# Patient Record
Sex: Female | Born: 1990
Health system: Southern US, Community
[De-identification: ages and names within clinical notes are randomized; demographics above are authoritative.]

## PROBLEM LIST (undated history)

## (undated) DIAGNOSIS — J302 Other seasonal allergic rhinitis: Secondary | ICD-10-CM

## (undated) DIAGNOSIS — M940 Chondrocostal junction syndrome [Tietze]: Secondary | ICD-10-CM

## (undated) DIAGNOSIS — L732 Hidradenitis suppurativa: Secondary | ICD-10-CM

## (undated) DIAGNOSIS — S149XXA Injury of unspecified nerves of neck, initial encounter: Secondary | ICD-10-CM

## (undated) HISTORY — DX: Injury of unspecified nerves of neck, initial encounter: S14.9XXA

## (undated) HISTORY — PX: NO PAST SURGERIES: SHX2092

---

## 2010-02-23 DIAGNOSIS — G589 Mononeuropathy, unspecified: Secondary | ICD-10-CM

## 2010-02-23 HISTORY — DX: Mononeuropathy, unspecified: G58.9

## 2011-01-23 ENCOUNTER — Emergency Department (HOSPITAL_COMMUNITY)
Admission: EM | Admit: 2011-01-23 | Discharge: 2011-01-23 | Disposition: A | Discharge status: Home / Self Care | Payer: BC Managed Care – PPO | Attending: Emergency Medicine | Admitting: Emergency Medicine

## 2011-01-23 ENCOUNTER — Encounter: Payer: Self-pay | Admitting: Emergency Medicine

## 2011-01-23 DIAGNOSIS — R209 Unspecified disturbances of skin sensation: Secondary | ICD-10-CM | POA: Insufficient documentation

## 2011-01-23 DIAGNOSIS — IMO0002 Reserved for concepts with insufficient information to code with codable children: Secondary | ICD-10-CM | POA: Insufficient documentation

## 2011-01-23 DIAGNOSIS — S139XXA Sprain of joints and ligaments of unspecified parts of neck, initial encounter: Secondary | ICD-10-CM | POA: Insufficient documentation

## 2011-01-23 DIAGNOSIS — M2569 Stiffness of other specified joint, not elsewhere classified: Secondary | ICD-10-CM | POA: Insufficient documentation

## 2011-01-23 DIAGNOSIS — T07XXXA Unspecified multiple injuries, initial encounter: Secondary | ICD-10-CM

## 2011-01-23 DIAGNOSIS — M549 Dorsalgia, unspecified: Secondary | ICD-10-CM | POA: Insufficient documentation

## 2011-01-23 DIAGNOSIS — M542 Cervicalgia: Secondary | ICD-10-CM | POA: Insufficient documentation

## 2011-01-23 DIAGNOSIS — X58XXXA Exposure to other specified factors, initial encounter: Secondary | ICD-10-CM | POA: Insufficient documentation

## 2011-01-23 MED ORDER — IBUPROFEN 800 MG PO TABS: 800.0000 mg | ORAL_TABLET | Freq: Three times a day (TID) | ORAL | Status: AC

## 2011-01-23 MED ORDER — CYCLOBENZAPRINE HCL 10 MG PO TABS: 10.0000 mg | ORAL_TABLET | Freq: Two times a day (BID) | ORAL | Status: AC | PRN

## 2011-01-23 MED ORDER — HYDROCODONE-ACETAMINOPHEN 5-325 MG PO TABS: ORAL_TABLET | ORAL | Status: DC

## 2011-01-23 MED ORDER — IBUPROFEN 800 MG PO TABS
800.0000 mg | ORAL_TABLET | Freq: Once | ORAL | Status: AC
  Administered 2011-01-23: 800 mg via ORAL
  Filled 2011-01-23: qty 1

## 2011-01-23 NOTE — ED Notes (Signed)
Pt c/o neck and upper back pain x 1 week; pt sts lifts boxes at work; pt sts pain with palpation and movment; pt denies obvious injury

## 2011-01-23 NOTE — ED Provider Notes (Signed)
History     CSN: [id number] Arrival date & time: 01/23/2011 11:26 AM   First MD Initiated Contact with Patient 01/23/11 1229      Chief Complaint  Patient presents with  . Back Pain    (Consider location/radiation/quality/duration/timing/severity/associated sxs/prior treatment) HPI Comments: Patient reports that she's had a pain in her upper left back over the last 2 days. This has required her to miss one of her 2 jobs for the last 2 days. Certain movements of her upper back and neck seem to exacerbate the pain. She also has had pain to the left side of her neck exacerbated by bending her neck or turning her head in certain directions. This has been present for several days and occasionally she'll get a numb sensation that goes down her left arm. She denies any weakness however. She denies any direct trauma, fevers, rash. She denies any difficulty swallowing, chest pain or shortness of breath. She has been taking some ibuprofen for the symptoms with minimal relief. She denies any bowel or or bladder difficulty. Denies any gait problems or lower sternum he weakness or numbness. Her second job is also with attacks where she does a lot of heavy lifting.  The history is provided by the patient.    History reviewed. No pertinent past medical history.  History reviewed. No pertinent past surgical history.  History reviewed. No pertinent family history.  History  Substance Use Topics  . Smoking status: Never Smoker   . Smokeless tobacco: Not on file  . Alcohol Use: Yes    OB History    Grav Para Term Preterm Abortions TAB SAB Ect Mult Living                  Review of Systems  Constitutional: Negative.   HENT: Positive for neck pain and neck stiffness. Negative for trouble swallowing.   Respiratory: Negative for shortness of breath.   Cardiovascular: Negative for chest pain.  Musculoskeletal: Positive for back pain. Negative for myalgias.  Skin: Negative for rash and wound.     Allergies  Review of patient's allergies indicates no known allergies.  Home Medications   Current Outpatient Rx  Name Route Sig Dispense Refill  . IBUPROFEN 200 MG PO TABS Oral Take 800 mg by mouth every 6 (six) hours as needed. For pain     . THERA M PLUS PO TABS Oral Take 1 tablet by mouth daily.        BP 136/75  Pulse 78  Temp(Src) 97.6 F (36.4 C) (Oral)  Resp 16  SpO2 100%  Physical Exam  Nursing note and vitals reviewed. Constitutional: She appears well-developed and well-nourished. No distress.  HENT:  Head: Normocephalic.  Neck: Neck supple.    Musculoskeletal:       Arms: Lymphadenopathy:    She has no cervical adenopathy.  Neurological: She is alert. She has normal strength. No sensory deficit.  Reflex Scores:      Bicep reflexes are 2+ on the left side. Skin: She is not diaphoretic.    ED Course  Procedures (including critical care time)  Labs Reviewed - No data to display No results found.   No diagnosis found.    MDM    NSAIDs, muscle relaxants and pt can be on light duty for the several days.   I do not suspect any sig spinal injury or abn's, no fever, supple neck.  Reproducible pain make this muscle strains most likely, probably due to heavy lifting at  work, working 2 jobs.         Gavin Pound. Demario Faniel, MD 01/23/11 1250

## 2011-01-23 NOTE — Discharge Instructions (Signed)
 Narcotic and benzodiazepine use may cause drowsiness, slowed breathing or dependence.  Please use with caution and do not drive, operate machinery or watch young children alone while taking them.  Taking combinations of these medications or drinking alcohol will potentiate these effects.     Flexeril  and Norco both fall in this category, so use with caution.  Ibuprofen  should not cause drowsiness and thus can be taken while working or driving.     Watch for weakness of left arm, fevers.

## 2011-02-12 ENCOUNTER — Encounter (HOSPITAL_COMMUNITY): Payer: Self-pay | Admitting: Emergency Medicine

## 2011-02-12 ENCOUNTER — Emergency Department (HOSPITAL_COMMUNITY)
Admission: EM | Admit: 2011-02-12 | Discharge: 2011-02-12 | Disposition: A | Discharge status: Home / Self Care | Payer: BC Managed Care – PPO | Attending: Emergency Medicine | Admitting: Emergency Medicine

## 2011-02-12 DIAGNOSIS — R51 Headache: Secondary | ICD-10-CM | POA: Insufficient documentation

## 2011-02-12 DIAGNOSIS — J3489 Other specified disorders of nose and nasal sinuses: Secondary | ICD-10-CM | POA: Insufficient documentation

## 2011-02-12 DIAGNOSIS — R0981 Nasal congestion: Secondary | ICD-10-CM

## 2011-02-12 MED ORDER — METOCLOPRAMIDE HCL 5 MG/ML IJ SOLN
10.0000 mg | Freq: Once | INTRAMUSCULAR | Status: AC
  Administered 2011-02-12: 10 mg via INTRAMUSCULAR
  Filled 2011-02-12: qty 2

## 2011-02-12 MED ORDER — DIPHENHYDRAMINE HCL 50 MG/ML IJ SOLN
25.0000 mg | Freq: Once | INTRAMUSCULAR | Status: AC
  Administered 2011-02-12: 25 mg via INTRAMUSCULAR
  Filled 2011-02-12: qty 1

## 2011-02-12 NOTE — ED Notes (Signed)
Headache, chills and nausea x 1 week

## 2011-02-12 NOTE — ED Provider Notes (Signed)
History     CSN: [id number]  Arrival date & time 02/12/11  1922   First MD Initiated Contact with Patient 02/12/11 2057      Chief Complaint  Patient presents with  . Influenza    Headache,chills and nausea x 1week    (Consider location/radiation/quality/duration/timing/severity/associated sxs/prior treatment) HPI Comments: The patient with one week history of right frontal headache. She does not have a history of migraines or other headaches. The headache waxes and wanes and sometimes goes away at night which she has at most of the time. This is not associated with photophobia or phonophobia. Patient is not had any nausea or vomiting. She denies any head injury. She's been taking Vicodin without any relief. Patient denies any upper respiratory symptoms until yesterday when she developed a runny nose. She denies sinus pressure or ear pain. Patient denies any syncope, trouble walking, weakness in extremities, vision changes.  Patient is a 20 y.o. female presenting with headaches. The history is provided by the patient.  Headache  This is a new problem. The current episode started more than 2 days ago. The problem occurs constantly. The problem has not changed since onset.The pain is located in the right unilateral and frontal region. The quality of the pain is described as dull. The pain is mild. The pain does not radiate. Pertinent negatives include no anorexia, no fever, no near-syncope, no syncope, no shortness of breath, no nausea and no vomiting. She has tried oral narcotic analgesics for the symptoms. The treatment provided no relief.    No past medical history on file.  No past surgical history on file.  No family history on file.  History  Substance Use Topics  . Smoking status: Never Smoker   . Smokeless tobacco: Not on file  . Alcohol Use: Yes    OB History    Grav Para Term Preterm Abortions TAB SAB Ect Mult Living                  Review of Systems    Constitutional: Positive for chills. Negative for fever.  HENT: Positive for congestion and rhinorrhea. Negative for sore throat and sinus pressure.   Eyes: Negative for photophobia, discharge, redness and visual disturbance.  Respiratory: Negative for shortness of breath.   Cardiovascular: Negative for chest pain, syncope and near-syncope.  Gastrointestinal: Negative for nausea, vomiting, abdominal pain, diarrhea, constipation and anorexia.  Genitourinary: Negative for dysuria and hematuria.  Musculoskeletal: Negative for myalgias.  Skin: Negative for rash.  Neurological: Positive for headaches. Negative for dizziness, syncope, speech difficulty, weakness and numbness.  Psychiatric/Behavioral: Negative for confusion.    Allergies  Review of patient's allergies indicates no known allergies.  Home Medications   Current Outpatient Rx  Name Route Sig Dispense Refill  . CHLORPHEN-PSEUDOEPHED-APAP 2-30-500 MG PO TABS Oral Take 1 tablet by mouth every 4 (four) hours as needed. For general cold symptom relief     . HYDROCODONE-ACETAMINOPHEN 5-325 MG PO TABS  1-2 tablets po q 6 hours prn severe pain 15 tablet 0  . IBUPROFEN 200 MG PO TABS Oral Take 800 mg by mouth every 6 (six) hours as needed. For pain     . THERA M PLUS PO TABS Oral Take 1 tablet by mouth daily.        There were no vitals taken for this visit.  Physical Exam  Nursing note and vitals reviewed. Constitutional: She is oriented to person, place, and time. She appears well-developed and well-nourished.  HENT:  Head: Normocephalic and atraumatic.  Right Ear: Tympanic membrane, external ear and ear canal normal.  Left Ear: Tympanic membrane, external ear and ear canal normal.  Nose: Nose normal. Right sinus exhibits no maxillary sinus tenderness and no frontal sinus tenderness. Left sinus exhibits no maxillary sinus tenderness and no frontal sinus tenderness.  Mouth/Throat: Uvula is midline, oropharynx is clear and moist  and mucous membranes are normal.  Eyes: Pupils are equal, round, and reactive to light. Right eye exhibits no discharge. Left eye exhibits no discharge.  Neck: Normal range of motion. Neck supple.  Cardiovascular: Normal rate and regular rhythm.  Exam reveals no gallop and no friction rub.   No murmur heard. Pulmonary/Chest: Effort normal and breath sounds normal. No respiratory distress. She has no wheezes.  Abdominal: Soft. There is no tenderness. There is no rebound and no guarding.  Musculoskeletal: Normal range of motion.  Neurological: She is alert and oriented to person, place, and time. She has normal strength. She displays normal reflexes. No cranial nerve deficit or sensory deficit. Gait normal. GCS eye subscore is 4. GCS verbal subscore is 5. GCS motor subscore is 6.  Skin: Skin is warm and dry. No rash noted.  Psychiatric: She has a normal mood and affect.    ED Course  Procedures (including critical care time)  Labs Reviewed - No data to display No results found.   1. Headache   2. Nasal congestion    Patient was seen and examined. She is not having neurological deficits. Patient given IM injection of Reglan and Benadryl. Patient urged to return with worsening symptoms or other concerns. Urged to return with weakness, worsening headache, persistent vomiting, visual changes. Patient given neurology referral. Urged followup with neurology if no improvement in one week.   MDM  Patient with one week of headache without concerning symptoms or neurological deficits. No upper respiratory tract infection symptoms. No head injury. Patient does have a history of neck pain. Pain treated in emergency department and patient given neurology referral.        Carolee Rota, PA 02/12/11 2336

## 2011-02-13 NOTE — ED Provider Notes (Signed)
Medical screening examination/treatment/procedure(s) were performed by non-physician practitioner and as supervising physician I was immediately available for consultation/collaboration.   Shelda Jakes, MD 02/13/11 [phone]

## 2011-07-17 ENCOUNTER — Ambulatory Visit: Payer: BC Managed Care – PPO

## 2011-07-17 ENCOUNTER — Ambulatory Visit (INDEPENDENT_AMBULATORY_CARE_PROVIDER_SITE_OTHER): Payer: BC Managed Care – PPO | Admitting: Family Medicine

## 2011-07-17 VITALS — BP 112/86 | HR 69 | Temp 98.1°F | Resp 16 | Ht <= 58 in | Wt 172.8 lb

## 2011-07-17 DIAGNOSIS — M25539 Pain in unspecified wrist: Secondary | ICD-10-CM

## 2011-07-17 DIAGNOSIS — M79609 Pain in unspecified limb: Secondary | ICD-10-CM

## 2011-07-17 DIAGNOSIS — M79643 Pain in unspecified hand: Secondary | ICD-10-CM

## 2011-07-17 NOTE — Progress Notes (Signed)
  Patient Name: Holly Hart Date of Birth: September 30, 1990 Medical Record Number: [id number] Gender: female Date of Encounter: 07/17/2011  History of Present Illness:  Holly Hart is a 21 y.o. very pleasant female patient who presents with the following:  She was playing basketball on wednesday she ran into another player and hurt her left pinky finger and wrist.  She is right handed.   The pinky was "huge" with swelling yesterday.  She has been applying ice.  No previous history of fracture, surgery, etc After finger xray she also mentioned that her right wrist was injured in the same accident and that it has felt sore and popped some since.   There is no problem list on file for this patient.  No past medical history on file. No past surgical history on file. History  Substance Use Topics  . Smoking status: Never Smoker   . Smokeless tobacco: Never Used  . Alcohol Use: Yes   No family history on file. No Known Allergies  Medication list has been reviewed and updated.  Review of Systems: As per HPI- otherwise negative.   Physical Examination: Filed Vitals:   07/17/11 1316  BP: 112/86  Pulse: 69  Temp: 98.1 F (36.7 C)  TempSrc: Oral  Resp: 16  Height: 4\' 9"  (1.448 m)  Weight: 172 lb 12.8 oz (78.382 kg)  SpO2: 100%    Body mass index is 37.39 kg/(m^2).   GEN: WDWN, NAD, Non-toxic, Alert & Oriented x 3 HEENT: Atraumatic, Normocephalic.  Ears and Nose: No external deformity. EXTR: No clubbing/cyanosis/edema NEURO: Normal gait.  PSYCH: Normally interactive. Conversant. Not depressed or anxious appearing.  Calm demeanor.  Left wrist is negative- no tenderness, swelling or bruising, normal flexion/ extension/ radial and ulnar deviation Left small finger is swollen, bruised and very tender Right wrist has minimal tenderness over the distal ulna  [facility] reading (PRIMARY) by  Dr. Patsy Lager. Left hand: No definite fracture.  Right wrist: negative   Assessment and  Plan: 1. Pain, hand  DG Hand Complete Left  2. Pain, wrist  DG Wrist Complete Right   Suspect a possible left pinky fracture, so have placed in a splint and buddy taped for protection.  Left wrist split as well for comfort.  Will follow- up with xray reports- let me know if still painful in a few days, Sooner if worse.

## 2011-10-06 ENCOUNTER — Emergency Department (HOSPITAL_COMMUNITY)
Admission: EM | Admit: 2011-10-06 | Discharge: 2011-10-06 | Disposition: A | Discharge status: Home / Self Care | Payer: BC Managed Care – PPO | Source: Home / Self Care | Attending: Emergency Medicine | Admitting: Emergency Medicine

## 2011-10-06 ENCOUNTER — Encounter (HOSPITAL_COMMUNITY): Payer: Self-pay | Admitting: *Deleted

## 2011-10-06 DIAGNOSIS — J329 Chronic sinusitis, unspecified: Secondary | ICD-10-CM

## 2011-10-06 HISTORY — DX: Chondrocostal junction syndrome (tietze): M94.0

## 2011-10-06 HISTORY — DX: Other seasonal allergic rhinitis: J30.2

## 2011-10-06 LAB — POCT URINALYSIS DIP (DEVICE)
Bilirubin Urine: NEGATIVE
Hgb urine dipstick: NEGATIVE
Leukocytes, UA: NEGATIVE
Nitrite: NEGATIVE
Protein, ur: NEGATIVE mg/dL
Urobilinogen, UA: 1 mg/dL (ref 0.0–1.0)
pH: 7 (ref 5.0–8.0)

## 2011-10-06 MED ORDER — PSEUDOEPHEDRINE-GUAIFENESIN ER 120-1200 MG PO TB12: 1.0000 | ORAL_TABLET | Freq: Two times a day (BID) | ORAL | Status: DC

## 2011-10-06 MED ORDER — IBUPROFEN 600 MG PO TABS: 600.0000 mg | ORAL_TABLET | Freq: Four times a day (QID) | ORAL | Status: AC | PRN

## 2011-10-06 MED ORDER — FLUTICASONE PROPIONATE 50 MCG/ACT NA SUSP: 2.0000 | Freq: Every day | NASAL | Status: DC

## 2011-10-06 NOTE — ED Notes (Signed)
Pt reports sinus congestion for the past 3 days. And stomach cramping for the past 2 weeks - denies nausea, vomiting or diarrhea. otc meds with no relief.

## 2011-10-06 NOTE — ED Provider Notes (Signed)
History     CSN: [id number]  Arrival date & time 10/06/11  1547   First MD Initiated Contact with Patient 10/06/11 1559      Chief Complaint  Patient presents with  . Facial Pain    (Consider location/radiation/quality/duration/timing/severity/associated sxs/prior treatment) HPI Comments: Pt with nasal congestion, postnasal drip, ST, nonproductive cough, bilateral frontal sinus pain/pressure worse with bending forward/lying down x 2 days, ear fullness. No fevers, N/V, other HA, bodyaches, dental pain, purulent nasal d/c. Taking otc cold meds w/o relief. No known sick contacts.    Patient is a 21 y.o. female presenting with URI. The history is provided by the patient. No language interpreter was used.  URI The primary symptoms include headaches. The current episode started 3 to 5 days ago. This is a new problem. The problem has not changed since onset. Symptoms associated with the illness include facial pain, sinus pressure, congestion and rhinorrhea. The illness is not associated with chills or plugged ear sensation. The following treatments were addressed: Acetaminophen was ineffective. A decongestant was ineffective. Aspirin was not tried. NSAIDs were not tried.    Past Medical History  Diagnosis Date  . Seasonal allergies   . Costochondritis     History reviewed. No pertinent past surgical history.  Family History  Problem Relation Age of Onset  . Family history unknown: Yes    History  Substance Use Topics  . Smoking status: Never Smoker   . Smokeless tobacco: Never Used  . Alcohol Use: Yes    OB History    Grav Para Term Preterm Abortions TAB SAB Ect Mult Living                  Review of Systems  Constitutional: Negative for chills.  HENT: Positive for congestion, rhinorrhea and sinus pressure.   Neurological: Positive for headaches.    Allergies  Review of patient's allergies indicates no known allergies.  Home Medications   Current Outpatient Rx    Name Route Sig Dispense Refill  . FLUTICASONE PROPIONATE 50 MCG/ACT NA SUSP Nasal Place 2 sprays into the nose daily. 16 g 0  . IBUPROFEN 600 MG PO TABS Oral Take 1 tablet (600 mg total) by mouth every 6 (six) hours as needed for pain. 30 tablet 0  . PSEUDOEPHEDRINE-GUAIFENESIN ER [phone] MG PO TB12 Oral Take 1 tablet by mouth 2 (two) times daily. 20 each 0    BP 130/84  Pulse 84  Temp 98.8 F (37.1 C) (Oral)  Resp 16  SpO2 99%  LMP 09/22/2011  Physical Exam  Nursing note and vitals reviewed. Constitutional: She appears well-developed and well-nourished.  HENT:  Head: Normocephalic and atraumatic.  Right Ear: Tympanic membrane and ear canal normal.  Left Ear: Tympanic membrane and ear canal normal.  Nose: Mucosal edema and rhinorrhea present. Right sinus exhibits maxillary sinus tenderness and frontal sinus tenderness. Left sinus exhibits maxillary sinus tenderness and frontal sinus tenderness.  Mouth/Throat: Uvula is midline and mucous membranes are normal. Posterior oropharyngeal erythema present. No oropharyngeal exudate.  Eyes: Conjunctivae and EOM are normal.  Neck: Normal range of motion. Neck supple.  Cardiovascular: Normal rate.   Pulmonary/Chest: Effort normal and breath sounds normal.  Abdominal: She exhibits no distension.  Musculoskeletal: Normal range of motion.  Lymphadenopathy:    She has no cervical adenopathy.  Neurological: She is alert. Coordination normal.  Skin: Skin is warm and dry. No rash noted.  Psychiatric: She has a normal mood and affect. Her behavior is normal.  Judgment and thought content normal.    ED Course  Procedures (including critical care time)   Labs Reviewed  POCT URINALYSIS DIP (DEVICE)  POCT PREGNANCY, URINE   No results found.   1. Sinusitis       MDM  No fevers >102, has had sx for < 10 days, no h/o double sickening. No historical or objective evidence of bacterial infection. No indication for abx. Will start flonase,  mucinex-d, increase fluids, nasal saline irrigation,  tylenol/motrin prn pain. Discussed MDM and plan with pt. Pt agrees with plan and will f/u with PMD of choice prn.    Luiz Blare, MD 10/06/11 1723

## 2013-01-31 ENCOUNTER — Encounter: Payer: Self-pay | Admitting: Gynecology

## 2013-02-01 ENCOUNTER — Encounter: Payer: Self-pay | Admitting: Gynecology

## 2013-02-01 ENCOUNTER — Ambulatory Visit (INDEPENDENT_AMBULATORY_CARE_PROVIDER_SITE_OTHER): Payer: BC Managed Care – PPO | Admitting: Gynecology

## 2013-02-01 VITALS — BP 106/68 | HR 84 | Resp 16 | Ht 68.5 in | Wt 190.0 lb

## 2013-02-01 DIAGNOSIS — Z0000 Encounter for general adult medical examination without abnormal findings: Secondary | ICD-10-CM

## 2013-02-01 DIAGNOSIS — Z01419 Encounter for gynecological examination (general) (routine) without abnormal findings: Secondary | ICD-10-CM

## 2013-02-01 DIAGNOSIS — L680 Hirsutism: Secondary | ICD-10-CM

## 2013-02-01 DIAGNOSIS — L309 Dermatitis, unspecified: Secondary | ICD-10-CM

## 2013-02-01 DIAGNOSIS — K219 Gastro-esophageal reflux disease without esophagitis: Secondary | ICD-10-CM

## 2013-02-01 DIAGNOSIS — L259 Unspecified contact dermatitis, unspecified cause: Secondary | ICD-10-CM

## 2013-02-01 LAB — POCT URINALYSIS DIPSTICK
Urobilinogen, UA: NEGATIVE
pH, UA: 5

## 2013-02-01 LAB — TESTOSTERONE: Testosterone: 49 ng/dL (ref 10–70)

## 2013-02-01 LAB — HEMOGLOBIN, FINGERSTICK: Hemoglobin, fingerstick: 12.3 g/dL (ref 12.0–16.0)

## 2013-02-01 MED ORDER — TRIAMCINOLONE ACETONIDE 0.5 % EX OINT: 1.0000 "application " | TOPICAL_OINTMENT | Freq: Two times a day (BID) | CUTANEOUS | Status: DC

## 2013-02-01 MED ORDER — EFLORNITHINE HCL 13.9 % EX CREA: TOPICAL_CREAM | CUTANEOUS | Status: DC

## 2013-02-01 NOTE — Patient Instructions (Addendum)
Use otc tagamet, nexium  Every day until the coughing stops.  EXERCISE AND DIET:  We recommended that you start or continue a regular exercise program for good health. Regular exercise means any activity that makes your heart beat faster and makes you sweat.  We recommend exercising at least 30 minutes per day at least 3 days a week, preferably 4 or 5.  We also recommend a diet low in fat and sugar.  Inactivity, poor dietary choices and obesity can cause diabetes, heart attack, stroke, and kidney damage, among others.    ALCOHOL AND SMOKING:  Women should limit their alcohol intake to no more than 7 drinks/beers/glasses of wine (combined, not each!) per week. Moderation of alcohol intake to this level decreases your risk of breast cancer and liver damage. And of course, no recreational drugs are part of a healthy lifestyle.  And absolutely no smoking or even second hand smoke. Most people know smoking can cause heart and lung diseases, but did you know it also contributes to weakening of your bones? Aging of your skin?  Yellowing of your teeth and nails?  CALCIUM AND VITAMIN D:  Adequate intake of calcium and Vitamin D are recommended.  The recommendations for exact amounts of these supplements seem to change often, but generally speaking 600 mg of calcium (either carbonate or citrate) and 800 units of Vitamin D per day seems prudent. Certain women may benefit from higher intake of Vitamin D.  If you are among these women, your doctor will have told you during your visit.    PAP SMEARS:  Pap smears, to check for cervical cancer or precancers,  have traditionally been done yearly, although recent scientific advances have shown that most women can have pap smears less often.  However, every woman still should have a physical exam from her gynecologist every year. It will include a breast check, inspection of the vulva and vagina to check for abnormal growths or skin changes, a visual exam of the cervix, and  then an exam to evaluate the size and shape of the uterus and ovaries.  And after 24 years of age, a rectal exam is indicated to check for rectal cancers. We will also provide age appropriate advice regarding health maintenance, like when you should have certain vaccines, screening for sexually transmitted diseases, bone density testing, colonoscopy, mammograms, etc.   MAMMOGRAMS:  All women over 24 years old should have a yearly mammogram. Many facilities now offer a "3D" mammogram, which may cost around $50 extra out of pocket. If possible,  we recommend you accept the option to have the 3D mammogram performed.  It both reduces the number of women who will be called back for extra views which then turn out to be normal, and it is better than the routine mammogram at detecting truly abnormal areas.    COLONOSCOPY:  Colonoscopy to screen for colon cancer is recommended for all women at age 90.  We know, you hate the idea of the prep.  We agree, BUT, having colon cancer and not knowing it is worse!!  Colon cancer so often starts as a polyp that can be seen and removed at colonscopy, which can quite literally save your life!  And if your first colonoscopy is normal and you have no family history of colon cancer, most women don't have to have it again for 10 years.  Once every ten years, you can do something that may end up saving your life, right?  We will  be happy to help you get it scheduled when you are ready.  Be sure to check your insurance coverage so you understand how much it will cost.  It may be covered as a preventative service at no cost, but you should check your particular policy.

## 2013-02-01 NOTE — Progress Notes (Signed)
22 y.o. Single  African American female   [id number] here for annual exam. Pt is currently sexually active.   First sexual activity at 22 years old, 5 number of lifetime partners.   Current partner for 2y, no dyspareunia.Marland Kitchenpt concerned re: facial hair, suing nair facial cream on jaw line, doing for years.  Pt also reports rash on neck after using perfume spray, has been putting vasoline and cocoanut butter on without relief.  Lastly reports recent URTI and now notices a persistent cough-nonproductive, worse at night  No LMP recorded.          Sexually active: yes  The current method of family planning is not indicted.    Exercising: no  The patient does not participate in regular exercise at present. Last pap: 02/01/12 Negative Alcohol: Weekends  Tobacco: no Drugs: no Gardisil: yes, completed: 2007/2008   Hgb: 12.3 ; Urine: Leuks 1    Health Maintenance  Topic Date Due  . Pap Smear  07/18/2006  . Tetanus/tdap  07/18/2007  . Influenza Vaccine  09/23/2012    Family History  Problem Relation Age of Onset  . Diabetes Mother   . Hypertension Mother   . Diabetes Father     There are no active problems to display for this patient.   Past Medical History  Diagnosis Date  . Seasonal allergies   . Costochondritis   . Pinched nerve in neck 2012    No past surgical history on file.  Allergies: Review of patient's allergies indicates no known allergies.  Current Outpatient Prescriptions  Medication Sig Dispense Refill  . fluticasone (FLONASE) 50 MCG/ACT nasal spray Place 2 sprays into the nose daily.  16 g  0  . IBUPROFEN PO Take by mouth.      . Pseudoephedrine-Guaifenesin (MUCINEX D) [phone] MG TB12 Take 1 tablet by mouth 2 (two) times daily.  20 each  0   No current facility-administered medications for this visit.    ROS: Pertinent items are noted in HPI.  Exam:    BP 106/68  Pulse 84  Resp 16  Ht 5' 8.5" (1.74 m)  Wt 190 lb (86.183 kg)  BMI 28.47 kg/m2 Weight change:  @WEIGHTCHANGE @ Last 3 height recordings:  Ht Readings from Last 3 Encounters:  02/01/13 5' 8.5" (1.74 m)  07/17/11 4\' 9"  (1.448 m)   General appearance: alert, cooperative and appears stated age Head: Normocephalic, without obvious abnormality, atraumatic Neck: no adenopathy, no carotid bruit, no JVD, supple, symmetrical, trachea midline and thyroid not enlarged, symmetric, no tenderness/mass/nodules Lungs: clear to auscultation bilaterally Breasts: normal appearance, no masses or tenderness Heart: regular rate and rhythm, S1, S2 normal, no murmur, click, rub or gallop Abdomen: soft, non-tender; bowel sounds normal; no masses,  no organomegaly Extremities: extremities normal, atraumatic, no cyanosis or edema Skin: Skin color, texture, turgor normal. No rashes or lesions, darkened area over anterior neck, skin intact Lymph nodes: Cervical, supraclavicular, and axillary nodes normal. no inguinal nodes palpated Neurologic: Grossly normal   Pelvic: External genitalia:  no lesions              Urethra: normal appearing urethra with no masses, tenderness or lesions              Bartholins and Skenes: normal                 Vagina: normal appearing vagina with normal color and discharge, no lesions  Cervix: normal appearance              Pap taken: yes        Bimanual Exam:  Uterus:  uterus is normal size, shape, consistency and nontender, retroverted                                      Adnexa:    normal adnexa in size, nontender and no masses                                      Rectovaginal: Confirms                                      Anus:  normal sphincter tone, no lesions  A: well woman Lesbian relationship Facial hair Dermatitis-contact cough     P: pap smear with reflex, guidelines reviewed Discussed facial hair, may be genetic-will check T4, rx vaniqua will add spironolactone if elevated T4, f/u 43m kenalong ointment to neck, remove necklace Lungs clear-suspect  GERD from recent URTI- nexium, tagamet or pepcid counseled on breast self exam, STD prevention, adequate intake of calcium and vitamin D, diet and exercise return annually or prn Discussed STD prevention, regular condom use.   Additional 71m spent re dermatitis, GERD secondary to cough and etiology and treatment of facial hair  An After Visit Summary was printed and given to the patient.

## 2013-02-03 MED ORDER — METRONIDAZOLE 500 MG PO TABS: 500.0000 mg | ORAL_TABLET | Freq: Two times a day (BID) | ORAL | Status: DC

## 2013-02-03 NOTE — Addendum Note (Signed)
Addended by: Lorraine Lax on: 02/03/2013 12:43 PM   Modules accepted: Orders

## 2013-02-09 ENCOUNTER — Emergency Department (HOSPITAL_COMMUNITY)
Admission: EM | Admit: 2013-02-09 | Discharge: 2013-02-09 | Disposition: A | Discharge status: Home / Self Care | Payer: BC Managed Care – PPO | Attending: Emergency Medicine | Admitting: Emergency Medicine

## 2013-02-09 ENCOUNTER — Encounter (HOSPITAL_COMMUNITY): Payer: Self-pay | Admitting: Emergency Medicine

## 2013-02-09 ENCOUNTER — Emergency Department (HOSPITAL_COMMUNITY): Payer: BC Managed Care – PPO

## 2013-02-09 DIAGNOSIS — Z8669 Personal history of other diseases of the nervous system and sense organs: Secondary | ICD-10-CM | POA: Insufficient documentation

## 2013-02-09 DIAGNOSIS — Z9109 Other allergy status, other than to drugs and biological substances: Secondary | ICD-10-CM | POA: Insufficient documentation

## 2013-02-09 DIAGNOSIS — Y9389 Activity, other specified: Secondary | ICD-10-CM | POA: Insufficient documentation

## 2013-02-09 DIAGNOSIS — Y9289 Other specified places as the place of occurrence of the external cause: Secondary | ICD-10-CM | POA: Insufficient documentation

## 2013-02-09 DIAGNOSIS — Z792 Long term (current) use of antibiotics: Secondary | ICD-10-CM | POA: Insufficient documentation

## 2013-02-09 DIAGNOSIS — X500XXA Overexertion from strenuous movement or load, initial encounter: Secondary | ICD-10-CM | POA: Insufficient documentation

## 2013-02-09 DIAGNOSIS — Z79899 Other long term (current) drug therapy: Secondary | ICD-10-CM | POA: Insufficient documentation

## 2013-02-09 DIAGNOSIS — M25462 Effusion, left knee: Secondary | ICD-10-CM

## 2013-02-09 DIAGNOSIS — M25562 Pain in left knee: Secondary | ICD-10-CM

## 2013-02-09 DIAGNOSIS — Z8739 Personal history of other diseases of the musculoskeletal system and connective tissue: Secondary | ICD-10-CM | POA: Insufficient documentation

## 2013-02-09 DIAGNOSIS — S8990XA Unspecified injury of unspecified lower leg, initial encounter: Secondary | ICD-10-CM | POA: Insufficient documentation

## 2013-02-09 MED ORDER — OXYCODONE-ACETAMINOPHEN 5-325 MG PO TABS
1.0000 | ORAL_TABLET | Freq: Once | ORAL | Status: AC
  Administered 2013-02-09: 1 via ORAL
  Filled 2013-02-09: qty 1

## 2013-02-09 MED ORDER — OXYCODONE-ACETAMINOPHEN 5-325 MG PO TABS: 1.0000 | ORAL_TABLET | ORAL | Status: DC | PRN

## 2013-02-09 NOTE — ED Notes (Signed)
Left knee pain since last night; heard a pop when she turned last night.

## 2013-02-09 NOTE — ED Provider Notes (Signed)
Medical screening examination/treatment/procedure(s) were performed by non-physician practitioner and as supervising physician I was immediately available for consultation/collaboration.  EKG Interpretation   None         Irbin Fines M Krishana Lutze, MD 02/09/13 2257 

## 2013-02-09 NOTE — ED Notes (Signed)
Pt presents with c/o left knee pain. Pt says her knee popped out last night while she was turning. Pt ambulatory to triage.

## 2013-02-09 NOTE — ED Provider Notes (Signed)
CSN: [id number]     Arrival date & time 02/09/13  1744 History   This chart was scribed for non-physician practitioner Trixie Dredge, PA-C  working with Lyanne Co, MD by Clydene Laming, ED Scribe. This patient was seen in room WTR7/WTR7 and the patient's care was started at 7:01 PM.   Chief Complaint  Patient presents with  . Knee Pain    The history is provided by the patient. No language interpreter was used.   HPI Comments: Holly Hart is a 22 y.o. female who presents to the Emergency Department complaining of left knee pain scored at 9/10 onset last night. Pt was throwing an object in the trash when she turned and states the knee popped out of place.Pt states the knee was out place for approximately 2 minutes and she reports closing her eyes and did not look at her knee. Pt also reports sharp pain in the back of the knee when she walks. Pain is throughout the anterior aspect of the knee. Pt has a hx of a mcl injury in high school and pt did not have surgery. Pt states the knee feels heavy when its cold (chronically). She took an ibuprofen last night with mild relief. She can not bend the knee or straighten it completely. Pt is limping when she walks but is ambulatory   Past Medical History  Diagnosis Date  . Seasonal allergies   . Costochondritis   . Pinched nerve in neck 2012   History reviewed. No pertinent past surgical history. Family History  Problem Relation Age of Onset  . Diabetes Mother   . Hypertension Mother   . Diabetes Father    History  Substance Use Topics  . Smoking status: Never Smoker   . Smokeless tobacco: Never Used  . Alcohol Use: Yes     Comment: Couple drinks on the weekends.   OB History   Grav Para Term Preterm Abortions TAB SAB Ect Mult Living   0 0 0 0 0 0 0 0 0 0      Review of Systems  Cardiovascular: Negative for leg swelling.  Musculoskeletal:       Left knee pain  Skin: Negative for color change, pallor and wound.  Neurological:  Negative for weakness and numbness.    Allergies  Review of patient's allergies indicates no known allergies.  Home Medications   Current Outpatient Rx  Name  Route  Sig  Dispense  Refill  . esomeprazole (NEXIUM) 20 MG capsule   Oral   Take 20 mg by mouth daily at 12 noon.         Marland Kitchen ibuprofen (ADVIL,MOTRIN) 200 MG tablet   Oral   Take 800 mg by mouth every 6 (six) hours as needed.         . metroNIDAZOLE (FLAGYL) 500 MG tablet   Oral   Take 500 mg by mouth 2 (two) times daily.          Triage Vitals:BP 135/69  Pulse 83  Temp(Src) 98.4 F (36.9 C) (Oral)  Resp 18  SpO2 100%  LMP 02/07/2013 Physical Exam  Nursing note and vitals reviewed. Constitutional: She appears well-developed and well-nourished. No distress.  HENT:  Head: Normocephalic and atraumatic.  Neck: Neck supple.  Pulmonary/Chest: Effort normal.  Musculoskeletal:       Left knee: She exhibits decreased range of motion, swelling and effusion. She exhibits no ecchymosis, no deformity, no laceration, normal alignment, no LCL laxity, no bony tenderness and no MCL laxity.  Left ankle: Normal.       Left lower leg: Normal.  Distal pulses intact.  Ankle brachial index > 1    Neurological: She is alert.  Skin: She is not diaphoretic.    ED Course  Procedures (including critical care time) DIAGNOSTIC STUDIES: Oxygen Saturation is 100% on RA, normal by my interpretation.    COORDINATION OF CARE: 7:11 PM- Discussed treatment plan with pt at bedside. Pt verbalized understanding and agreement with plan.   DIAGNOSTIC STUDIES: Oxygen Saturation is 100% on RA, normal by my interpretation.    COORDINATION OF CARE: 7:11 PM- Discussed treatment plan with pt at bedside. Pt verbalized understanding and agreement with plan.   Labs Review Labs Reviewed - No data to display Imaging Review Dg Knee Complete 4 Views Left  02/09/2013   CLINICAL DATA:  Knee popped while standing up, left knee pain, prior  MCL injury  EXAM: LEFT KNEE - COMPLETE 4+ VIEW  COMPARISON:  None  FINDINGS: Osseous mineralization grossly normal for technique.  Large knee joint effusion.  No acute fracture, dislocation or bone destruction.  Small non fused ossicle along lateral margin of lateral femoral condyle.  IMPRESSION: Large knee joint effusion.  No definite acute osseous findings.   Electronically Signed   By: Ulyses Southward M.D.   On: 02/09/2013 19:04    EKG Interpretation   None      Discussed pt with Dr Patria Mane.  ABIs are normal.  Doubt vascular injury.   ABI  142/138 = 1. 02  MDM   1. Knee effusion, left   2. Left knee pain    Pt with twisting injury to left knee that occurred yesterday.  Pt stated the knee felt out of place but describes it as her kneecap possibly feeling out of place (she did not look at her knee and it returned to normal spontaneously).  This was not a forceful mechanism as pt was still when it happened and the force was a simple twisting motion.  ABIs calculated, is normal.  Pt placed in knee immobilizer, given crutches, d/c home with percocet, ortho follow up.  Discussed result, findings, treatment, and follow up  with patient.  Pt given return precautions.  Pt verbalizes understanding and agrees with plan.       I personally performed the services described in this documentation, which was scribed in my presence. The recorded information has been reviewed and is accurate.     Trixie Dredge, PA-C 02/09/13 2021

## 2013-02-26 ENCOUNTER — Encounter: Payer: Self-pay | Admitting: Gynecology

## 2013-02-28 ENCOUNTER — Encounter: Payer: Self-pay | Admitting: Gynecology

## 2014-01-12 ENCOUNTER — Telehealth: Payer: Self-pay | Admitting: Gynecology

## 2014-01-12 NOTE — Telephone Encounter (Signed)
lmtcb to reschedule aex from 02/02/14 with Dr. Farrel GobbleLathrop.

## 2014-02-02 ENCOUNTER — Ambulatory Visit: Payer: BC Managed Care – PPO | Admitting: Gynecology

## 2014-02-07 ENCOUNTER — Encounter: Payer: Self-pay | Admitting: Gynecology

## 2014-03-20 ENCOUNTER — Encounter: Payer: Self-pay | Admitting: Obstetrics & Gynecology

## 2014-11-05 IMAGING — CR DG KNEE COMPLETE 4+V*L*
4 series · 4 of 4 positions shown · non-contrast
Comparison: None

CLINICAL DATA: Knee popped while standing up, left knee pain, prior
MCL injury

EXAM:
LEFT KNEE - COMPLETE 4+ VIEW

[t knee ap left]
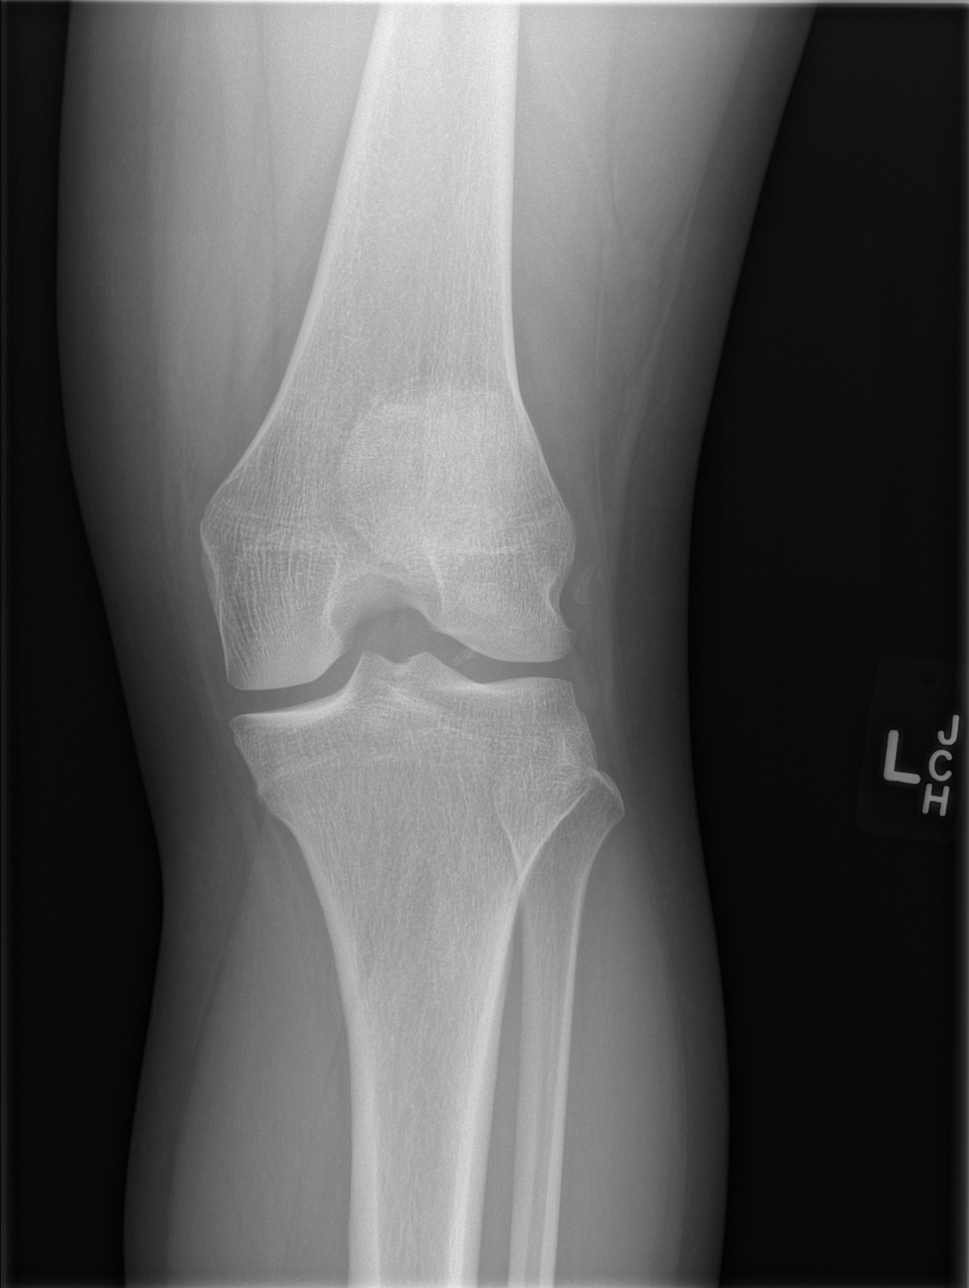

[t knee obl left (1 of 2)]
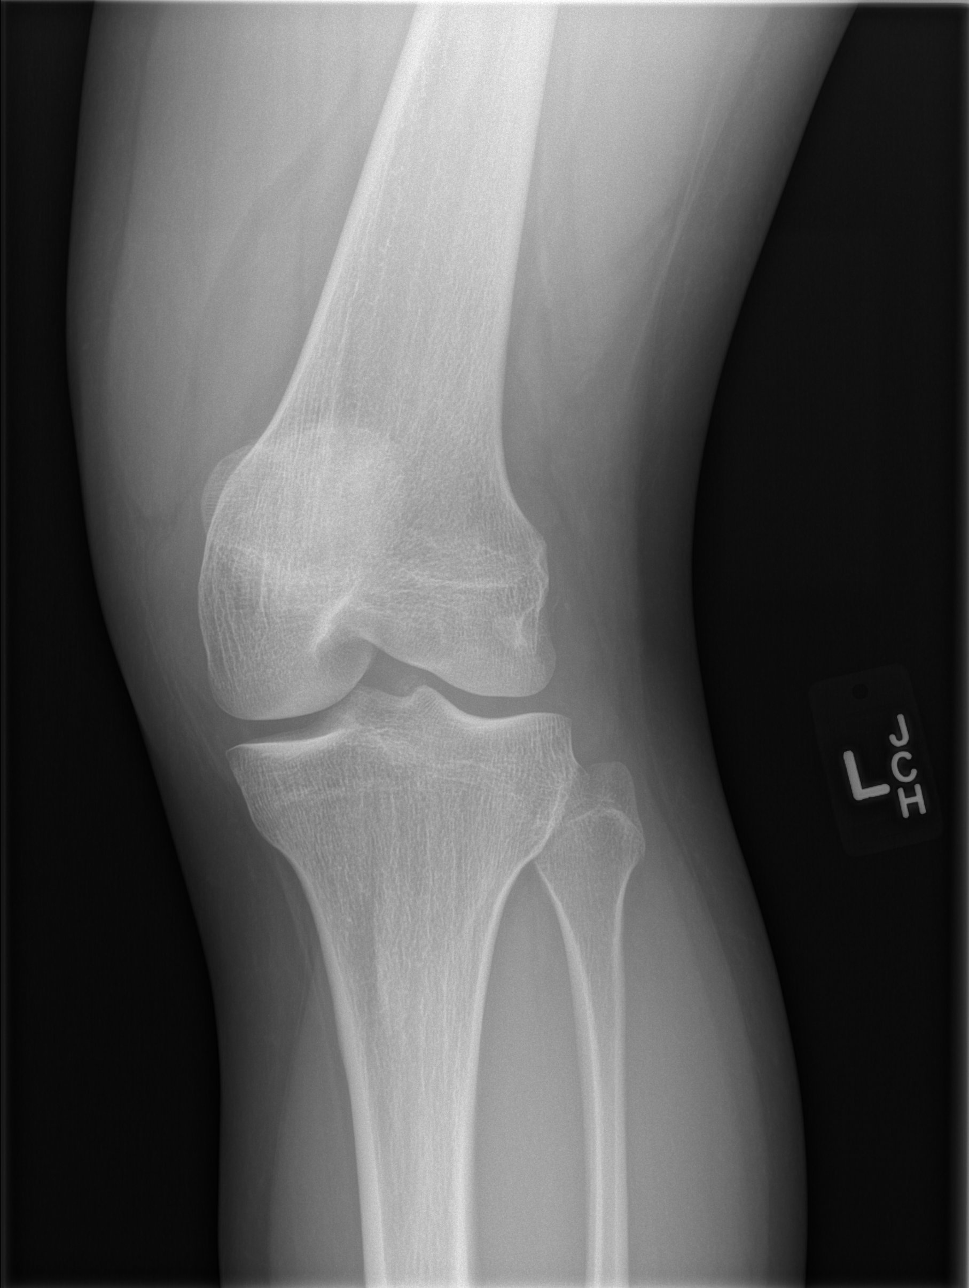

[t knee obl left (2 of 2)]
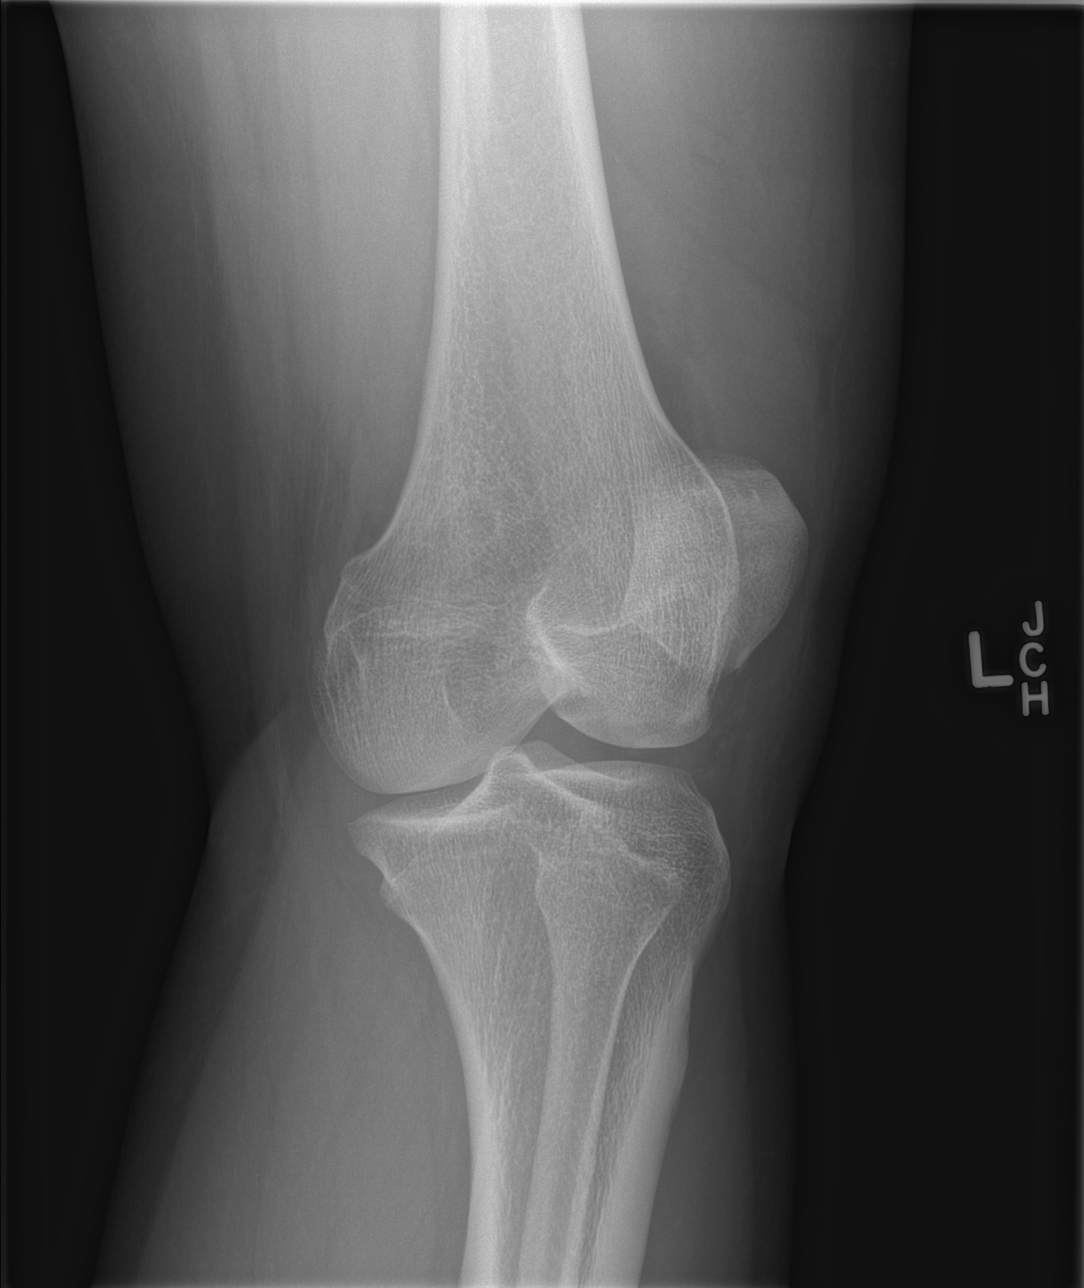

[t knee lat left]
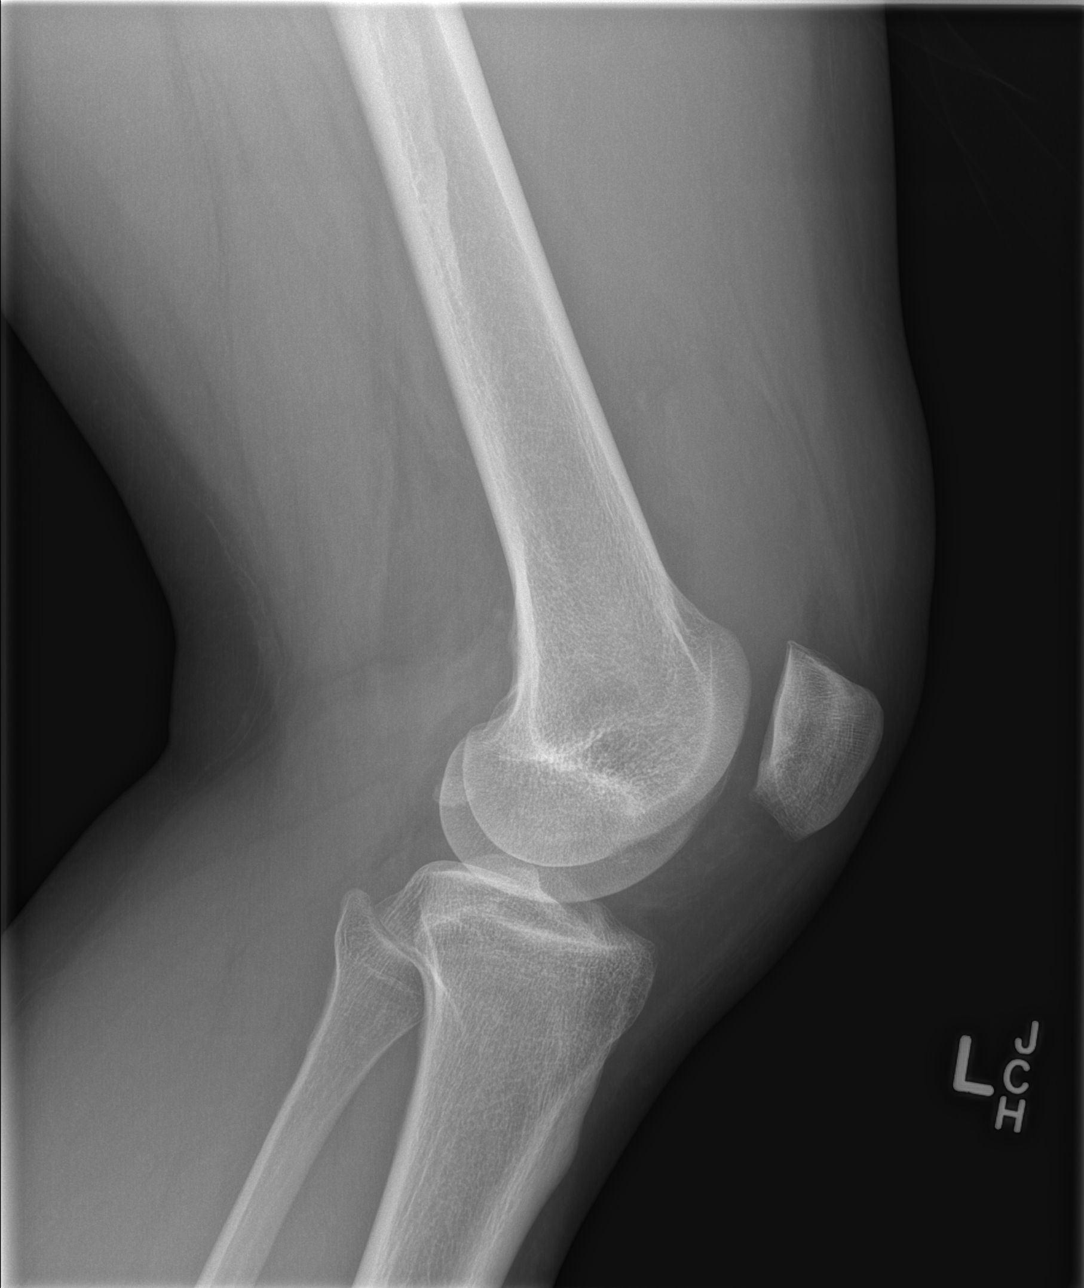

[4 of 4 positions shown; findings below may reference images not displayed]

FINDINGS: Osseous mineralization grossly normal for technique.

Large knee joint effusion.

No acute fracture, dislocation or bone destruction.

Small non fused ossicle along lateral margin of lateral femoral
condyle.
IMPRESSION: Large knee joint effusion.

No definite acute osseous findings.

## 2015-04-17 ENCOUNTER — Emergency Department (HOSPITAL_COMMUNITY)
Admission: EM | Admit: 2015-04-17 | Discharge: 2015-04-17 | Disposition: A | Discharge status: Home / Self Care | Payer: 59 | Attending: Emergency Medicine | Admitting: Emergency Medicine

## 2015-04-17 ENCOUNTER — Encounter (HOSPITAL_COMMUNITY): Payer: Self-pay | Admitting: Emergency Medicine

## 2015-04-17 ENCOUNTER — Emergency Department (HOSPITAL_COMMUNITY): Payer: 59

## 2015-04-17 DIAGNOSIS — Z8669 Personal history of other diseases of the nervous system and sense organs: Secondary | ICD-10-CM | POA: Diagnosis not present

## 2015-04-17 DIAGNOSIS — Z7951 Long term (current) use of inhaled steroids: Secondary | ICD-10-CM | POA: Insufficient documentation

## 2015-04-17 DIAGNOSIS — Z8739 Personal history of other diseases of the musculoskeletal system and connective tissue: Secondary | ICD-10-CM | POA: Diagnosis not present

## 2015-04-17 DIAGNOSIS — J209 Acute bronchitis, unspecified: Secondary | ICD-10-CM | POA: Diagnosis not present

## 2015-04-17 DIAGNOSIS — J302 Other seasonal allergic rhinitis: Secondary | ICD-10-CM | POA: Diagnosis not present

## 2015-04-17 DIAGNOSIS — R05 Cough: Secondary | ICD-10-CM | POA: Diagnosis present

## 2015-04-17 DIAGNOSIS — Z792 Long term (current) use of antibiotics: Secondary | ICD-10-CM | POA: Diagnosis not present

## 2015-04-17 DIAGNOSIS — Z79899 Other long term (current) drug therapy: Secondary | ICD-10-CM | POA: Insufficient documentation

## 2015-04-17 DIAGNOSIS — J069 Acute upper respiratory infection, unspecified: Secondary | ICD-10-CM | POA: Insufficient documentation

## 2015-04-17 MED ORDER — FLUTICASONE PROPIONATE 50 MCG/ACT NA SUSP: 2.0000 | Freq: Every day | NASAL | Status: DC

## 2015-04-17 MED ORDER — GUAIFENESIN-CODEINE 100-10 MG/5ML PO SOLN: 5.0000 mL | Freq: Three times a day (TID) | ORAL | Status: DC | PRN

## 2015-04-17 MED ORDER — PREDNISONE 20 MG PO TABS: 40.0000 mg | ORAL_TABLET | Freq: Every day | ORAL | Status: DC

## 2015-04-17 MED ORDER — ALBUTEROL SULFATE HFA 108 (90 BASE) MCG/ACT IN AERS
2.0000 | INHALATION_SPRAY | Freq: Once | RESPIRATORY_TRACT | Status: AC
  Administered 2015-04-17: 2 via RESPIRATORY_TRACT
  Filled 2015-04-17: qty 6.7

## 2015-04-17 MED ORDER — CETIRIZINE-PSEUDOEPHEDRINE ER 5-120 MG PO TB12: 1.0000 | ORAL_TABLET | Freq: Two times a day (BID) | ORAL | Status: DC

## 2015-04-17 MED ORDER — PREDNISONE 20 MG PO TABS
60.0000 mg | ORAL_TABLET | Freq: Once | ORAL | Status: AC
  Administered 2015-04-17: 60 mg via ORAL
  Filled 2015-04-17: qty 3

## 2015-04-17 NOTE — Discharge Instructions (Signed)
Acute Bronchitis °Bronchitis is inflammation of the airways that extend from the windpipe into the lungs (bronchi). The inflammation often causes mucus to develop. This leads to a cough, which is the most common symptom of bronchitis.  °In acute bronchitis, the condition usually develops suddenly and goes away over time, usually in a couple weeks. Smoking, allergies, and asthma can make bronchitis worse. Repeated episodes of bronchitis may cause further lung problems.  °CAUSES °Acute bronchitis is most often caused by the same virus that causes a cold. The virus can spread from person to person (contagious) through coughing, sneezing, and touching contaminated objects. °SIGNS AND SYMPTOMS  °· Cough.   °· Fever.   °· Coughing up mucus.   °· Body aches.   °· Chest congestion.   °· Chills.   °· Shortness of breath.   °· Sore throat.   °DIAGNOSIS  °Acute bronchitis is usually diagnosed through a physical exam. Your health care provider will also ask you questions about your medical history. Tests, such as chest X-rays, are sometimes done to rule out other conditions.  °TREATMENT  °Acute bronchitis usually goes away in a couple weeks. Oftentimes, no medical treatment is necessary. Medicines are sometimes given for relief of fever or cough. Antibiotic medicines are usually not needed but may be prescribed in certain situations. In some cases, an inhaler may be recommended to help reduce shortness of breath and control the cough. A cool mist vaporizer may also be used to help thin bronchial secretions and make it easier to clear the chest.  °HOME CARE INSTRUCTIONS °· Get plenty of rest.   °· Drink enough fluids to keep your urine clear or pale yellow (unless you have a medical condition that requires fluid restriction). Increasing fluids may help thin your respiratory secretions (sputum) and reduce chest congestion, and it will prevent dehydration.   °· Take medicines only as directed by your health care provider. °· If  you were prescribed an antibiotic medicine, finish it all even if you start to feel better. °· Avoid smoking and secondhand smoke. Exposure to cigarette smoke or irritating chemicals will make bronchitis worse. If you are a smoker, consider using nicotine gum or skin patches to help control withdrawal symptoms. Quitting smoking will help your lungs heal faster.   °· Reduce the chances of another bout of acute bronchitis by washing your hands frequently, avoiding people with cold symptoms, and trying not to touch your hands to your mouth, nose, or eyes.   °· Keep all follow-up visits as directed by your health care provider.   °SEEK MEDICAL CARE IF: °Your symptoms do not improve after 1 week of treatment.  °SEEK IMMEDIATE MEDICAL CARE IF: °· You develop an increased fever or chills.   °· You have chest pain.   °· You have severe shortness of breath. °· You have bloody sputum.   °· You develop dehydration. °· You faint or repeatedly feel like you are going to pass out. °· You develop repeated vomiting. °· You develop a severe headache. °MAKE SURE YOU:  °· Understand these instructions. °· Will watch your condition. °· Will get help right away if you are not doing well or get worse. °  °This information is not intended to replace advice given to you by your health care provider. Make sure you discuss any questions you have with your health care provider. °  °Document Released: 03/19/2004 Document Revised: 03/02/2014 Document Reviewed: 08/02/2012 °Elsevier Interactive Patient Education ©2016 [employer] ° °Upper Respiratory Infection, Adult °Most upper respiratory infections (URIs) are a viral infection of the air passages leading   to the lungs. A URI affects the nose, throat, and upper air passages. The most common type of URI is nasopharyngitis and is typically referred to as "the common cold." °URIs run their course and usually go away on their own. Most of the time, a URI does not require medical attention, but  sometimes a bacterial infection in the upper airways can follow a viral infection. This is called a secondary infection. Sinus and middle ear infections are common types of secondary upper respiratory infections. °Bacterial pneumonia can also complicate a URI. A URI can worsen asthma and chronic obstructive pulmonary disease (COPD). Sometimes, these complications can require emergency medical care and may be life threatening.  °CAUSES °Almost all URIs are caused by viruses. A virus is a type of germ and can spread from one person to another.  °RISKS FACTORS °You may be at risk for a URI if:  °· You smoke.   °· You have chronic heart or lung disease. °· You have a weakened defense (immune) system.   °· You are very young or very old.   °· You have nasal allergies or asthma. °· You work in crowded or poorly ventilated areas. °· You work in health care facilities or schools. °SIGNS AND SYMPTOMS  °Symptoms typically develop 2-3 days after you come in contact with a cold virus. Most viral URIs last 7-10 days. However, viral URIs from the influenza virus (flu virus) can last 14-18 days and are typically more severe. Symptoms may include:  °· Runny or stuffy (congested) nose.   °· Sneezing.   °· Cough.   °· Sore throat.   °· Headache.   °· Fatigue.   °· Fever.   °· Loss of appetite.   °· Pain in your forehead, behind your eyes, and over your cheekbones (sinus pain). °· Muscle aches.   °DIAGNOSIS  °Your health care provider may diagnose a URI by: °· Physical exam. °· Tests to check that your symptoms are not due to another condition such as: °¨ Strep throat. °¨ Sinusitis. °¨ Pneumonia. °¨ Asthma. °TREATMENT  °A URI goes away on its own with time. It cannot be cured with medicines, but medicines may be prescribed or recommended to relieve symptoms. Medicines may help: °· Reduce your fever. °· Reduce your cough. °· Relieve nasal congestion. °HOME CARE INSTRUCTIONS  °· Take medicines only as directed by your health care  provider.   °· Gargle warm saltwater or take cough drops to comfort your throat as directed by your health care provider. °· Use a warm mist humidifier or inhale steam from a shower to increase air moisture. This may make it easier to breathe. °· Drink enough fluid to keep your urine clear or pale yellow.   °· Eat soups and other clear broths and maintain good nutrition.   °· Rest as needed.   °· Return to work when your temperature has returned to normal or as your health care provider advises. You may need to stay home longer to avoid infecting others. You can also use a face mask and careful hand washing to prevent spread of the virus. °· Increase the usage of your inhaler if you have asthma.   °· Do not use any tobacco products, including cigarettes, chewing tobacco, or electronic cigarettes. If you need help quitting, ask your health care provider. °PREVENTION  °The best way to protect yourself from getting a cold is to practice good hygiene.  °· Avoid oral or hand contact with people with cold symptoms.   °· Wash your hands often if contact occurs.   °There is no clear evidence that   vitamin C, vitamin E, echinacea, or exercise reduces the chance of developing a cold. However, it is always recommended to get plenty of rest, exercise, and practice good nutrition.  SEEK MEDICAL CARE IF:   You are getting worse rather than better.   Your symptoms are not controlled by medicine.   You have chills.  You have worsening shortness of breath.  You have brown or red mucus.  You have yellow or brown nasal discharge.  You have pain in your face, especially when you bend forward.  You have a fever.  You have swollen neck glands.  You have pain while swallowing.  You have white areas in the back of your throat. SEEK IMMEDIATE MEDICAL CARE IF:   You have severe or persistent:  Headache.  Ear pain.  Sinus pain.  Chest pain.  You have chronic lung disease and any of the  following:  Wheezing.  Prolonged cough.  Coughing up blood.  A change in your usual mucus.  You have a stiff neck.  You have changes in your:  Vision.  Hearing.  Thinking.  Mood. MAKE SURE YOU:   Understand these instructions.  Will watch your condition.  Will get help right away if you are not doing well or get worse.   This information is not intended to replace advice given to you by your health care provider. Make sure you discuss any questions you have with your health care provider.   Document Released: 08/05/2000 Document Revised: 06/26/2014 Document Reviewed: 05/17/2013 Elsevier Interactive Patient Education 2016 [employer]  Hay Fever Hay fever is an allergic reaction to particles in the air. It cannot be passed from person to person. It cannot be cured, but it can be controlled. CAUSES  Hay fever is caused by something that triggers an allergic reaction (allergens). The following are examples of allergens:  Ragweed.  Feathers.  Animal dander.  Grass and tree pollens.  Cigarette smoke.  House dust.  Pollution. SYMPTOMS   Sneezing.  Runny or stuffy nose.  Tearing eyes.  Itchy eyes, nose, mouth, throat, skin, or other area.  Sore throat.  Headache.  Decreased sense of smell or taste. DIAGNOSIS Your caregiver will perform a physical exam and ask questions about the symptoms you are having.Allergy testing may be done to determine exactly what triggers your hay fever.  TREATMENT   Over-the-counter medicines may help symptoms. These include:  Antihistamines.  Decongestants. These may help with nasal congestion.  Your caregiver may prescribe medicines if over-the-counter medicines do not work.  Some people benefit from allergy shots when other medicines are not helpful. HOME CARE INSTRUCTIONS   Avoid the allergen that is causing your symptoms, if possible.  Take all medicine as told by your caregiver. SEEK MEDICAL CARE IF:    You have severe allergy symptoms and your current medicines are not helping.  Your treatment was working at one time, but you are now experiencing symptoms.  You have sinus congestion and pressure.  You develop a fever or headache.  You have thick nasal discharge.  You have asthma and have a worsening cough and wheezing. SEEK IMMEDIATE MEDICAL CARE IF:   You have swelling of your tongue or lips.  You have trouble breathing.  You feel lightheaded or like you are going to faint.  You have cold sweats.  You have a fever.   This information is not intended to replace advice given to you by your health care provider. Make sure you discuss any questions  you have with your health care provider.   Document Released: 02/09/2005 Document Revised: 05/04/2011 Document Reviewed: 08/22/2014 Elsevier Interactive Patient Education [employer]

## 2015-04-17 NOTE — ED Provider Notes (Signed)
CSN: [id number]     Arrival date & time 04/17/15  1033 History  By signing my name below, I, Holly Hart, attest that this documentation has been prepared under the direction and in the presence of Danelle Berry, PA-C. Electronically Signed: Tanda Hart, ED Scribe. 04/17/2015. 1:41 PM.   Chief Complaint  Patient presents with  . Cough  . Nasal Congestion   The history is provided by the patient. No language interpreter was used.     HPI Comments: Holly Hart is a 25 y.o. female who presents to the Emergency Department complaining of gradual onset, constant, productive cough with yellow phlegm x 1 week, gradually worsening. Pt also complains of subjective fever, chills, night sweats, nasal congestion, shortness of breath while coughing, and voice change that has since improved. Pt does not feel short of breath with activity. She has been taking Mucinex DM without relief. No recent sick contact with similar symptoms. Denies sore throat, rhinorrhea, or any other associated symptoms. Pt is a non smoker. She mentions hx of asthma in the past.The last time she has had to use an inhaler was approximately 1 year ago.   No CP, abdominal pain, N, V, D.  Past Medical History  Diagnosis Date  . Seasonal allergies   . Costochondritis   . Pinched nerve in neck 2012   History reviewed. No pertinent past surgical history. Family History  Problem Relation Age of Onset  . Diabetes Mother   . Hypertension Mother   . Diabetes Father    Social History  Substance Use Topics  . Smoking status: Never Smoker   . Smokeless tobacco: Never Used  . Alcohol Use: Yes     Comment: Couple drinks on the weekends.   OB History    Gravida Para Term Preterm AB TAB SAB Ectopic Multiple Living       Review of Systems  Constitutional: Positive for fever (subjective), chills and diaphoresis.  HENT: Positive for congestion and voice change (resolved). Negative for rhinorrhea and sore  throat.   Respiratory: Positive for cough and shortness of breath (while coughing).   All other systems reviewed and are negative.   Allergies  Review of patient's allergies indicates no known allergies.  Home Medications   Prior to Admission medications   Medication Sig Start Date End Date Taking? Authorizing Provider  cetirizine-pseudoephedrine (ZYRTEC-D) 5-120 MG tablet Take 1 tablet by mouth 2 (two) times daily. 04/17/15   Danelle Berry, PA-C  esomeprazole (NEXIUM) 20 MG capsule Take 20 mg by mouth daily at 12 noon.    Historical Provider, MD  fluticasone (FLONASE) 50 MCG/ACT nasal spray Place 2 sprays into both nostrils daily. 04/17/15   Danelle Berry, PA-C  guaiFENesin-codeine 100-10 MG/5ML syrup Take 5 mLs by mouth 3 (three) times daily as needed for cough. 04/17/15   Danelle Berry, PA-C  ibuprofen (ADVIL,MOTRIN) 200 MG tablet Take 800 mg by mouth every 6 (six) hours as needed.    Historical Provider, MD  metroNIDAZOLE (FLAGYL) 500 MG tablet Take 500 mg by mouth 2 (two) times daily. 02/03/13   Douglass Rivers, MD  oxyCODONE-acetaminophen (PERCOCET/ROXICET) 5-325 MG per tablet Take 1 tablet by mouth every 4 (four) hours as needed for severe pain. 02/09/13   Trixie Dredge, PA-C  predniSONE (DELTASONE) 20 MG tablet Take 2 tablets (40 mg total) by mouth daily. 04/17/15   Danelle Berry, PA-C   BP 145/88 mmHg  Pulse 63  Temp(Src) 98.5 F (  36.9 C) (Oral)  Resp 18  SpO2 100%  LMP 04/01/2015   Physical Exam  Constitutional: She is oriented to person, place, and time. She appears well-developed and well-nourished. No distress.  HENT:  Head: Normocephalic and atraumatic.  Right Ear: Tympanic membrane normal.  Left Ear: Tympanic membrane normal.  Mouth/Throat: Uvula is midline and mucous membranes are normal. Posterior oropharyngeal erythema present. No oropharyngeal exudate.  Nasal mucosa edematous and erythematous; clear discharge  Eyes: Conjunctivae and EOM are normal.  Neck: Neck supple. No  tracheal deviation present.  Cardiovascular: Normal rate and regular rhythm.  Exam reveals no gallop and no friction rub.   No murmur heard. Pulmonary/Chest: Breath sounds normal. No respiratory distress. She has no wheezes. She has no rales.  LCTA anteriorly and posteriorly   Musculoskeletal: Normal range of motion.  Neurological: She is alert and oriented to person, place, and time.  Skin: Skin is warm and dry.  Psychiatric: She has a normal mood and affect. Her behavior is normal.  Nursing note and vitals reviewed.   ED Course  Procedures (including critical care time)  DIAGNOSTIC STUDIES: Oxygen Saturation is 99% on RA, normal by my interpretation.    COORDINATION OF CARE: 1:37 PM-Discussed treatment plan which includes Rx Prednisone and inhaler with pt at bedside and pt agreed to plan.   Labs Review Labs Reviewed - No data to display  Imaging Review Dg Chest 2 View  04/17/2015  CLINICAL DATA:  Productive cough for the past 5 days. Shortness of breath. Fever 4 days ago. EXAM: CHEST  2 VIEW COMPARISON:  None. FINDINGS: Normal sized heart. Clear lungs. Minimal central peribronchial thickening. Unremarkable bones. IMPRESSION: Minimal bronchitic changes. Electronically Signed   By: Beckie Salts M.D.   On: 04/17/2015 13:18   I have personally reviewed and evaluated these images as part of my medical decision-making.   EKG Interpretation None      MDM   Pt with improving URI sx and cough, remote hx of asthma, no current wheeze or exacerbation, may have mild bronchitis.  Pt has normal VS, CXR negative for PNA, pertinent for minimal bronchitic changes.  Pt encouraged to resume allergy tx.  Pt safe to discharge home, she is well appearing.  D/C'd with steroid burst, albuterol inhaler, zyrtec D, and cough syrup.  Return precautions reviewed.  Final diagnoses:  Acute bronchitis, unspecified organism  Other seasonal allergic rhinitis  URI (upper respiratory infection)   I  personally performed the services described in this documentation, which was scribed in my presence. The recorded information has been reviewed and is accurate.      Danelle Berry, PA-C 04/17/15 2123  Eber Hong, MD 04/18/15 [phone]

## 2015-04-17 NOTE — ED Notes (Signed)
Pt from home for with reports of nasal congestion and productive cough x1 week, pt states yellow sputum. Pt denies any cp or sob at this time, states "When I cough a lot my chest just burns." nad noted.

## 2016-04-05 ENCOUNTER — Emergency Department (HOSPITAL_COMMUNITY)
Admission: EM | Admit: 2016-04-05 | Discharge: 2016-04-05 | Disposition: A | Discharge status: Home / Self Care | Payer: Managed Care, Other (non HMO) | Attending: Emergency Medicine | Admitting: Emergency Medicine

## 2016-04-05 ENCOUNTER — Encounter (HOSPITAL_COMMUNITY): Payer: Self-pay | Admitting: Emergency Medicine

## 2016-04-05 DIAGNOSIS — B349 Viral infection, unspecified: Secondary | ICD-10-CM | POA: Insufficient documentation

## 2016-04-05 DIAGNOSIS — Z79899 Other long term (current) drug therapy: Secondary | ICD-10-CM | POA: Diagnosis not present

## 2016-04-05 DIAGNOSIS — R05 Cough: Secondary | ICD-10-CM | POA: Diagnosis present

## 2016-04-05 MED ORDER — BENZONATATE 100 MG PO CAPS: 100.0000 mg | ORAL_CAPSULE | Freq: Three times a day (TID) | ORAL | 0 refills | Status: DC

## 2016-04-05 NOTE — ED Triage Notes (Signed)
Patient dx with flu on Friday at urgent care. Patient was prescribed tamiflu. Patient reports blood in mucous since last night.

## 2016-04-05 NOTE — Discharge Instructions (Signed)
Please read attached information. If you experience any new or worsening signs or symptoms please return to the emergency room for evaluation. Please follow-up with your primary care provider or specialist as discussed. Please use medication prescribed only as directed and discontinue taking if you have any concerning signs or symptoms.   °

## 2016-04-05 NOTE — ED Provider Notes (Signed)
[facility]-EMERGENCY DEPT Provider Note   CSN: [id number] Arrival date & time: 04/05/16  1118  By signing my name below, I, Octavia Heirrianna Nassar, attest that this documentation has been prepared under the direction and in the presence of Newell RubbermaidJeffrey Jaslen Adcox, PA-C.  Electronically Signed: Octavia HeirArianna Nassar, ED Scribe. 04/05/16. 3:18 PM.    History   Chief Complaint Chief Complaint  Patient presents with  . Influenza   The history is provided by the patient. No language interpreter was used.   HPI Comments: Holly Hart is a 26 y.o. female who presents to the Emergency Department complaining of a persistent, gradual worsening productive cough x 5 days. Pt notes coughing up blood tinged mucus last night and this morning. She has associated generalized myalgias/arthralgias, sore throat, and mild shortness of breath secondary to cough. Pt states she had a fever of 101.2 ~ 2 days ago but has not had one since. Pt was seen at [facility] ~ 3 days ago and was diagnosed with the flu. She was prescribed tamiflu to alleviate her symptoms without significant relief. She notes that her sore throat became persistently worse last night in which she took tussinex with moderate relief. She denies fever and difficulty urinating.   Past Medical History:  Diagnosis Date  . Costochondritis   . Pinched nerve in neck 2012  . Seasonal allergies     Patient Active Problem List   Diagnosis Date Noted  . Hirsutism 02/01/2013  . Dermatitis 02/01/2013  . GERD (gastroesophageal reflux disease) 02/01/2013    History reviewed. No pertinent surgical history.  OB History    Gravida Para Term Preterm AB Living   0 0 0 0 0 0   SAB TAB Ectopic Multiple Live Births   0 0 0 0         Home Medications    Prior to Admission medications   Medication Sig Start Date End Date Taking? Authorizing Provider  benzonatate (TESSALON) 100 MG capsule Take 1 capsule (100 mg total) by mouth every 8 (eight) hours. 04/05/16   Eyvonne MechanicJeffrey  Lasonja Lakins, PA-C  cetirizine-pseudoephedrine (ZYRTEC-D) 5-120 MG tablet Take 1 tablet by mouth 2 (two) times daily. 04/17/15   Danelle BerryLeisa Tapia, PA-C  esomeprazole (NEXIUM) 20 MG capsule Take 20 mg by mouth daily at 12 noon.    Historical Provider, MD  fluticasone (FLONASE) 50 MCG/ACT nasal spray Place 2 sprays into both nostrils daily. 04/17/15   Danelle BerryLeisa Tapia, PA-C  guaiFENesin-codeine 100-10 MG/5ML syrup Take 5 mLs by mouth 3 (three) times daily as needed for cough. 04/17/15   Danelle BerryLeisa Tapia, PA-C  ibuprofen (ADVIL,MOTRIN) 200 MG tablet Take 800 mg by mouth every 6 (six) hours as needed.    Historical Provider, MD  metroNIDAZOLE (FLAGYL) 500 MG tablet Take 500 mg by mouth 2 (two) times daily. 02/03/13   Douglass Riversracy Lathrop, MD  oxyCODONE-acetaminophen (PERCOCET/ROXICET) 5-325 MG per tablet Take 1 tablet by mouth every 4 (four) hours as needed for severe pain. 02/09/13   Trixie DredgeEmily West, PA-C  predniSONE (DELTASONE) 20 MG tablet Take 2 tablets (40 mg total) by mouth daily. 04/17/15   Danelle BerryLeisa Tapia, PA-C    Family History Family History  Problem Relation Age of Onset  . Diabetes Mother   . Hypertension Mother   . Diabetes Father     Social History Social History  Substance Use Topics  . Smoking status: Never Smoker  . Smokeless tobacco: Never Used  . Alcohol use Yes     Comment: Couple drinks on the weekends.  Allergies   Patient has no known allergies.   Review of Systems Review of Systems  A complete 10 system review of systems was obtained and all systems are negative except as noted in the HPI and PMH.   Physical Exam Updated Vital Signs BP 139/84   Pulse 72   Temp 98.4 F (36.9 C)   Resp 18   Ht 5\' 9"  (1.753 m)   Wt 88.9 kg   LMP 03/18/2016   SpO2 98%   BMI 28.94 kg/m   Physical Exam  Constitutional: She is oriented to person, place, and time. She appears well-developed and well-nourished.  HENT:  Head: Normocephalic.  Mouth/Throat: Oropharynx is clear and moist. No oropharyngeal  exudate.  Eyes: EOM are normal.  Neck: Normal range of motion.  Pulmonary/Chest: Effort normal and breath sounds normal. No respiratory distress. She has no wheezes. She has no rales. She exhibits no tenderness.  Abdominal: She exhibits no distension.  Musculoskeletal: Normal range of motion.  Neurological: She is alert and oriented to person, place, and time.  Psychiatric: She has a normal mood and affect.  Nursing note and vitals reviewed.    ED Treatments / Results  DIAGNOSTIC STUDIES: Oxygen Saturation is 100% on RA, normal by my interpretation.  COORDINATION OF CARE:  11:52 AM Discussed treatment plan with pt at bedside and pt agreed to plan.  Labs (all labs ordered are listed, but only abnormal results are displayed) Labs Reviewed - No data to display  EKG  EKG Interpretation None       Radiology No results found.  Procedures Procedures (including critical care time)  Medications Ordered in ED Medications - No data to display   Initial Impression / Assessment and Plan / ED Course  I have reviewed the triage vital signs and the nursing notes.  Pertinent labs & imaging results that were available during my care of the patient were reviewed by me and considered in my medical decision making (see chart for details).       Final Clinical Impressions(s) / ED Diagnoses   Final diagnoses:  Viral illness   Labs:  Imaging:  Consults:  Therapeutics:  Discharge Meds:   Assessment/Plan:  26 year old female presents today with likely viral illness. She is well appearing in no acute distress. Clear lung sounds, afebrile. She'll be discharged home with cough medication, primary care follow-up and return precautions. She verbalized understanding and agreement to today's plan had no further questions or concerns  I personally performed the services described in this documentation, which was scribed in my presence. The recorded information has been reviewed and is  accurate. New Prescriptions Discharge Medication List as of 04/05/2016 12:23 PM    START taking these medications   Details  benzonatate (TESSALON) 100 MG capsule Take 1 capsule (100 mg total) by mouth every 8 (eight) hours., Starting Sun 04/05/2016, Print         Eyvonne Mechanic, PA-C 04/05/16 1518    Maia Plan, MD 04/05/16 [phone]

## 2017-07-14 ENCOUNTER — Encounter: Payer: Self-pay | Admitting: Emergency Medicine

## 2017-07-14 ENCOUNTER — Other Ambulatory Visit: Payer: Self-pay

## 2017-07-14 ENCOUNTER — Ambulatory Visit
Admission: EM | Admit: 2017-07-14 | Discharge: 2017-07-14 | Disposition: A | Discharge status: Home / Self Care | Payer: Managed Care, Other (non HMO) | Attending: Family Medicine | Admitting: Family Medicine

## 2017-07-14 DIAGNOSIS — K219 Gastro-esophageal reflux disease without esophagitis: Secondary | ICD-10-CM | POA: Insufficient documentation

## 2017-07-14 DIAGNOSIS — M940 Chondrocostal junction syndrome [Tietze]: Secondary | ICD-10-CM | POA: Insufficient documentation

## 2017-07-14 DIAGNOSIS — R079 Chest pain, unspecified: Secondary | ICD-10-CM | POA: Insufficient documentation

## 2017-07-14 DIAGNOSIS — Z79899 Other long term (current) drug therapy: Secondary | ICD-10-CM | POA: Diagnosis not present

## 2017-07-14 DIAGNOSIS — Z8249 Family history of ischemic heart disease and other diseases of the circulatory system: Secondary | ICD-10-CM | POA: Diagnosis not present

## 2017-07-14 DIAGNOSIS — R0789 Other chest pain: Secondary | ICD-10-CM

## 2017-07-14 MED ORDER — PREDNISONE 20 MG PO TABS: 20.0000 mg | ORAL_TABLET | Freq: Every day | ORAL | 0 refills | Status: DC

## 2017-07-14 NOTE — ED Triage Notes (Addendum)
Patient in today c/o chest pain since yesterday. Patient states she has sharp pain when she moves or breathes deep. She states she feels like she can't get a deep breath. Patient denies any cough or heavy lifting. Patient denies light headedness, nausea. Patient states if she holds perfectly still then it doesn't hurt.

## 2017-07-14 NOTE — ED Provider Notes (Signed)
[facility]    CSN: [id number] Arrival date & time: 07/14/17  1330     History   Chief Complaint Chief Complaint  Patient presents with  . Chest Pain    HPI Holly Hart is a 27 y.o. female.   27 yo female with a c/o chest pain since yesterday. States pain is sharp and worse with movement or deep breaths. Denies any fevers, chills, cough, shortness of breath, injuries, nausea, diaphoresis.   The history is provided by the patient.    Past Medical History:  Diagnosis Date  . Costochondritis   . Pinched nerve in neck 2012  . Seasonal allergies     Patient Active Problem List   Diagnosis Date Noted  . Hirsutism 02/01/2013  . Dermatitis 02/01/2013  . GERD (gastroesophageal reflux disease) 02/01/2013    History reviewed. No pertinent surgical history.  OB History    Gravida  0   Para  0   Term  0   Preterm  0   AB  0   Living  0     SAB  0   TAB  0   Ectopic  0   Multiple  0   Live Births               Home Medications    Prior to Admission medications   Medication Sig Start Date End Date Taking? Authorizing Provider  benzonatate (TESSALON) 100 MG capsule Take 1 capsule (100 mg total) by mouth every 8 (eight) hours. 04/05/16   Hedges, Tinnie Gens, PA-C  cetirizine-pseudoephedrine (ZYRTEC-D) 5-120 MG tablet Take 1 tablet by mouth 2 (two) times daily. 04/17/15   Danelle Berry, PA-C  esomeprazole (NEXIUM) 20 MG capsule Take 20 mg by mouth daily at 12 noon.    [provider]  fluticasone (FLONASE) 50 MCG/ACT nasal spray Place 2 sprays into both nostrils daily. 04/17/15   Danelle Berry, PA-C  guaiFENesin-codeine 100-10 MG/5ML syrup Take 5 mLs by mouth 3 (three) times daily as needed for cough. 04/17/15   Danelle Berry, PA-C  ibuprofen (ADVIL,MOTRIN) 200 MG tablet Take 800 mg by mouth every 6 (six) hours as needed.    [provider]  metroNIDAZOLE (FLAGYL) 500 MG tablet Take 500 mg by mouth 2 (two) times daily. 02/03/13    Douglass Rivers, MD  oxyCODONE-acetaminophen (PERCOCET/ROXICET) 5-325 MG per tablet Take 1 tablet by mouth every 4 (four) hours as needed for severe pain. 02/09/13   Trixie Dredge, PA-C  predniSONE (DELTASONE) 20 MG tablet Take 1 tablet (20 mg total) by mouth daily. 07/14/17   Payton Mccallum, MD    Family History Family History  Problem Relation Age of Onset  . Diabetes Mother   . Hypertension Mother   . Diabetes Father     Social History Social History   Tobacco Use  . Smoking status: Never Smoker  . Smokeless tobacco: Never Used  Substance Use Topics  . Alcohol use: Yes    Comment: Couple drinks on the weekends.  . Drug use: No     Allergies   Patient has no known allergies.   Review of Systems Review of Systems   Physical Exam Triage Vital Signs ED Triage Vitals  Enc Vitals Group     BP 07/14/17 1344 130/80     Pulse Rate 07/14/17 1344 74     Resp 07/14/17 1344 16     Temp 07/14/17 1344 98.1 F (36.7 C)     Temp Source 07/14/17 1344 Oral  SpO2 07/14/17 1344 100 %     Weight 07/14/17 1345 204 lb (92.5 kg)     Height 07/14/17 1345  (1.753 m)     Head Circumference --      Peak Flow --      Pain Score 07/14/17 1344 7     Pain Loc --      Pain Edu? --      Excl. in GC? --    No data found.  Updated Vital Signs BP 130/80 (BP Location: Left Arm)   Pulse 74   Temp 98.1 F (36.7 C) (Oral)   Resp 16   Ht  (1.753 m)   Wt 204 lb (92.5 kg)   LMP 06/16/2017 (Approximate)   SpO2 100%   BMI 30.13 kg/m   Visual Acuity Right Eye Distance:   Left Eye Distance:   Bilateral Distance:    Right Eye Near:   Left Eye Near:    Bilateral Near:     Physical Exam  Constitutional: She appears well-developed and well-nourished. No distress.  Cardiovascular: Normal rate, regular rhythm, normal heart sounds and intact distal pulses.  No murmur heard. Pulmonary/Chest: Effort normal and breath sounds normal. No stridor. No respiratory distress. She has no  wheezes. She has no rales. She exhibits tenderness (reproducible mid/left chest wall).  Abdominal: Soft. Bowel sounds are normal. She exhibits no distension and no mass. There is no tenderness. There is no rebound and no guarding.  Skin: She is not diaphoretic.  Nursing note and vitals reviewed.    UC Treatments / Results  Labs (all labs ordered are listed, but only abnormal results are displayed) Labs Reviewed - No data to display  EKG None  Radiology No results found.  Procedures ED EKG Date/Time: 07/14/2017 4:18 PM Performed by: Payton Mccallum, MD Authorized by: Payton Mccallum, MD   ECG reviewed by ED Physician in the absence of a cardiologist: yes   Previous ECG:    Previous ECG:  Unavailable Interpretation:    Interpretation: normal   Rate:    ECG rate assessment: normal   Rhythm:    Rhythm: sinus rhythm   Ectopy:    Ectopy: none   QRS:    QRS axis:  Normal Conduction:    Conduction: normal   ST segments:    ST segments:  Normal T waves:    T waves: normal     (including critical care time)  Medications Ordered in UC Medications - No data to display  Initial Impression / Assessment and Plan / UC Course  I have reviewed the triage vital signs and the nursing notes.  Pertinent labs & imaging results that were available during my care of the patient were reviewed by me and considered in my medical decision making (see chart for details).      Final Clinical Impressions(s) / UC Diagnoses   Final diagnoses:  Chest wall pain  Costochondritis   Discharge Instructions   None    ED Prescriptions    Medication Sig Dispense Auth. Provider   predniSONE (DELTASONE) 20 MG tablet Take 1 tablet (20 mg total) by mouth daily. 5 tablet Payton Mccallum, MD      1.ekg results and diagnosis reviewed with patient 2. rx as per orders above; reviewed possible side effects, interactions, risks and benefits  3. Recommend supportive treatment with ice, otc  acetaminophen 4. Follow-up prn if symptoms worsen or don't improve Controlled Substance Prescriptions [place] Controlled Substance Registry consulted? Not Applicable  Payton Mccallum, MD 07/14/17 [phone]

## 2018-02-26 ENCOUNTER — Other Ambulatory Visit: Payer: Self-pay

## 2018-02-26 ENCOUNTER — Encounter: Payer: Self-pay | Admitting: Emergency Medicine

## 2018-02-26 ENCOUNTER — Ambulatory Visit
Admission: EM | Admit: 2018-02-26 | Discharge: 2018-02-26 | Disposition: A | Discharge status: Home / Self Care | Payer: Managed Care, Other (non HMO) | Attending: Emergency Medicine | Admitting: Emergency Medicine

## 2018-02-26 ENCOUNTER — Ambulatory Visit (INDEPENDENT_AMBULATORY_CARE_PROVIDER_SITE_OTHER): Payer: Managed Care, Other (non HMO)

## 2018-02-26 DIAGNOSIS — S60121A Contusion of right index finger with damage to nail, initial encounter: Secondary | ICD-10-CM | POA: Diagnosis not present

## 2018-02-26 DIAGNOSIS — W230XXA Caught, crushed, jammed, or pinched between moving objects, initial encounter: Secondary | ICD-10-CM | POA: Diagnosis not present

## 2018-02-26 DIAGNOSIS — M79641 Pain in right hand: Secondary | ICD-10-CM

## 2018-02-26 MED ORDER — IBUPROFEN 800 MG PO TABS: 800.0000 mg | ORAL_TABLET | Freq: Three times a day (TID) | ORAL | 0 refills | Status: AC | PRN

## 2018-02-26 NOTE — Discharge Instructions (Addendum)
CONTUSION OF FINGER: Your x-ray was negative for fracture. Wear finger splint. Stressed avoiding painful activities . Reviewed RICE guidelines. Use medications as directed, including NSAIDs. I have explained that it is acceptable to take NSAIDs every 4-6 hours and they should not cause drowsiness. May use Tylenol in between doses of NSAIDs. F/u with PCP or return to our office for reexamination, and please feel free to call or return at any time for any questions or concerns you may have and we will be happy to help you!

## 2018-02-26 NOTE — ED Provider Notes (Signed)
[facility]    CSN: [id number] Arrival date & time: 02/26/18  1347     History   Chief Complaint Chief Complaint  Patient presents with  . Finger Injury    right 2nd finger    HPI Holly Hart is a 28 y.o. female. Patient states that she smashed the tip of her right index finger in her car door earlier today. She has some bruising of the finger pad. Pain is 7/10 and throbbing. Denies nail damage. Denies numbness/tingling. She has iced it but not taken any medicine. Has full ROM but pain with flexion at the distal fingertip. She has no other concerns today.  HPI  Past Medical History:  Diagnosis Date  . Costochondritis   . Pinched nerve in neck 2012  . Seasonal allergies     Patient Active Problem List   Diagnosis Date Noted  . Hirsutism 02/01/2013  . Dermatitis 02/01/2013  . GERD (gastroesophageal reflux disease) 02/01/2013    History reviewed. No pertinent surgical history.  OB History    Gravida  0   Para  0   Term  0   Preterm  0   AB  0   Living  0     SAB  0   TAB  0   Ectopic  0   Multiple  0   Live Births               Home Medications    Prior to Admission medications   Medication Sig Start Date End Date Taking? Authorizing Provider  cetirizine-pseudoephedrine (ZYRTEC-D) 5-120 MG tablet Take 1 tablet by mouth 2 (two) times daily. 04/17/15  Yes Danelle Berry, PA-C  fluticasone (FLONASE) 50 MCG/ACT nasal spray Place 2 sprays into both nostrils daily. 04/17/15  Yes Danelle Berry, PA-C  benzonatate (TESSALON) 100 MG capsule Take 1 capsule (100 mg total) by mouth every 8 (eight) hours. 04/05/16   Hedges, Tinnie Gens, PA-C  esomeprazole (NEXIUM) 20 MG capsule Take 20 mg by mouth daily at 12 noon.    [provider]  guaiFENesin-codeine 100-10 MG/5ML syrup Take 5 mLs by mouth 3 (three) times daily as needed for cough. 04/17/15   Danelle Berry, PA-C  ibuprofen (ADVIL,MOTRIN) 800 MG tablet Take 1 tablet (800 mg total) by mouth  every 8 (eight) hours as needed for up to 7 days. 02/26/18 03/05/18  Eusebio Friendly B, PA-C  metroNIDAZOLE (FLAGYL) 500 MG tablet Take 500 mg by mouth 2 (two) times daily. 02/03/13   Douglass Rivers, MD  oxyCODONE-acetaminophen (PERCOCET/ROXICET) 5-325 MG per tablet Take 1 tablet by mouth every 4 (four) hours as needed for severe pain. 02/09/13   Trixie Dredge, PA-C  predniSONE (DELTASONE) 20 MG tablet Take 1 tablet (20 mg total) by mouth daily. 07/14/17   Payton Mccallum, MD    Family History Family History  Problem Relation Age of Onset  . Diabetes Mother   . Hypertension Mother   . Diabetes Father     Social History Social History   Tobacco Use  . Smoking status: Never Smoker  . Smokeless tobacco: Never Used  Substance Use Topics  . Alcohol use: Yes    Comment: Couple drinks on the weekends.  . Drug use: No     Allergies   Patient has no known allergies.   Review of Systems Review of Systems  Musculoskeletal: Positive for arthralgias and joint swelling. Negative for myalgias.  Skin: Positive for color change. Negative for pallor, rash and wound.  Neurological: Negative  for dizziness, weakness and numbness.  Hematological: Does not bruise/bleed easily.     Physical Exam Triage Vital Signs ED Triage Vitals  Enc Vitals Group     BP 02/26/18 1459 (!) 147/75     Pulse Rate 02/26/18 1459 73     Resp 02/26/18 1459 16     Temp 02/26/18 1459 97.7 F (36.5 C)     Temp Source 02/26/18 1459 Oral     SpO2 02/26/18 1459 100 %     Weight 02/26/18 1457 194 lb (88 kg)     Height 02/26/18 1457 5\' 9"  (1.753 m)     Head Circumference --      Peak Flow --      Pain Score 02/26/18 1456 6     Pain Loc --      Pain Edu? --      Excl. in GC? --    No data found.  Updated Vital Signs BP (!) 147/75 (BP Location: Left Arm)   Pulse 73   Temp 97.7 F (36.5 C) (Oral)   Resp 16   Ht 5\' 9"  (1.753 m)   Wt 194 lb (88 kg)   LMP 02/12/2018 (Approximate)   SpO2 100%   BMI 28.65 kg/m    Visual Acuity Right Eye Distance:   Left Eye Distance:   Bilateral Distance:    Right Eye Near:   Left Eye Near:    Bilateral Near:     Physical Exam Vitals signs and nursing note reviewed.  Constitutional:      General: She is not in acute distress.    Appearance: Normal appearance. She is normal weight.  HENT:     Head: Normocephalic and atraumatic.  Eyes:     General: No scleral icterus.    Conjunctiva/sclera: Conjunctivae normal.  Cardiovascular:     Rate and Rhythm: Normal rate and regular rhythm.     Pulses: Normal pulses.     Heart sounds: Normal heart sounds. No murmur.  Pulmonary:     Effort: Pulmonary effort is normal. No respiratory distress.     Breath sounds: Normal breath sounds. No wheezing or rhonchi.  Musculoskeletal:     Comments: RIGHT HAND: There is mild swelling and ecchymosis of the distal right index finger. Mildly decreased ROM at the DIP joint due to pain, normal sensation.  Skin:    General: Skin is warm and dry.     Findings: Bruising present.  Neurological:     General: No focal deficit present.     Mental Status: She is alert and oriented to person, place, and time.     Motor: No weakness.     Gait: Gait normal.  Psychiatric:        Mood and Affect: Mood normal.        Behavior: Behavior normal.        Thought Content: Thought content normal.      UC Treatments / Results  Labs (all labs ordered are listed, but only abnormal results are displayed) Labs Reviewed - No data to display  EKG None  Radiology Dg Finger Index Right  Result Date: 02/26/2018 CLINICAL DATA:  Slammed second finger right hand in car door today. EXAM: RIGHT INDEX FINGER 2+V COMPARISON:  None. FINDINGS: There is no evidence of fracture or dislocation. There is no evidence of arthropathy or other focal bone abnormality. Soft tissues are unremarkable. IMPRESSION: Negative. Electronically Signed   By: Elberta Fortis M.D.   On: 02/26/2018 16:00  Procedures Procedures (including critical care time)  Medications Ordered in UC Medications - No data to display  Initial Impression / Assessment and Plan / UC Course  I have reviewed the triage vital signs and the nursing notes.  Pertinent labs & imaging results that were available during my care of the patient were reviewed by me and considered in my medical decision making (see chart for details).    Final Clinical Impressions(s) / UC Diagnoses   Final diagnoses:  Contusion of right index finger with damage to nail, initial encounter     Discharge Instructions     CONTUSION OF FINGER: Your x-ray was negative for fracture. Wear finger splint. Stressed avoiding painful activities . Reviewed RICE guidelines. Use medications as directed, including NSAIDs. I have explained that it is acceptable to take NSAIDs every 4-6 hours and they should not cause drowsiness. May use Tylenol in between doses of NSAIDs. F/u with PCP or return to our office for reexamination, and please feel free to call or return at any time for any questions or concerns you may have and we will be happy to help you!     ED Prescriptions    Medication Sig Dispense Auth. Provider   ibuprofen (ADVIL,MOTRIN) 800 MG tablet Take 1 tablet (800 mg total) by mouth every 8 (eight) hours as needed for up to 7 days. 21 tablet Shirlee LatchEaves, Shyteria Lewis B, PA-C     Controlled Substance Prescriptions [place] Controlled Substance Registry consulted? Not Applicable   Gareth Morganaves, Kamauri Kathol B, PA-C 03/01/18 1251

## 2018-02-26 NOTE — ED Triage Notes (Signed)
Patient states that she slammed her right 2nd finger in her car door this morning.

## 2018-09-05 ENCOUNTER — Ambulatory Visit (INDEPENDENT_AMBULATORY_CARE_PROVIDER_SITE_OTHER): Payer: Managed Care, Other (non HMO)

## 2018-09-05 ENCOUNTER — Ambulatory Visit
Admission: EM | Admit: 2018-09-05 | Discharge: 2018-09-05 | Disposition: A | Discharge status: Home / Self Care | Payer: Managed Care, Other (non HMO) | Attending: Family Medicine | Admitting: Family Medicine

## 2018-09-05 ENCOUNTER — Other Ambulatory Visit: Payer: Self-pay

## 2018-09-05 ENCOUNTER — Encounter: Payer: Self-pay | Admitting: Emergency Medicine

## 2018-09-05 DIAGNOSIS — R0789 Other chest pain: Secondary | ICD-10-CM | POA: Diagnosis present

## 2018-09-05 DIAGNOSIS — R0602 Shortness of breath: Secondary | ICD-10-CM | POA: Diagnosis present

## 2018-09-05 DIAGNOSIS — B349 Viral infection, unspecified: Secondary | ICD-10-CM | POA: Diagnosis present

## 2018-09-05 DIAGNOSIS — Z7189 Other specified counseling: Secondary | ICD-10-CM | POA: Diagnosis present

## 2018-09-05 DIAGNOSIS — J020 Streptococcal pharyngitis: Secondary | ICD-10-CM | POA: Diagnosis present

## 2018-09-05 LAB — RAPID STREP SCREEN (MED CTR MEBANE ONLY): Streptococcus, Group A Screen (Direct): POSITIVE — AB

## 2018-09-05 MED ORDER — AMOXICILLIN 875 MG PO TABS: 875.0000 mg | ORAL_TABLET | Freq: Two times a day (BID) | ORAL | 0 refills | Status: DC

## 2018-09-05 NOTE — Discharge Instructions (Signed)
Take antibiotic as prescribed.  Rest. Drink plenty of fluids.  Over-the-counter medication as needed.  Monitor self closely.  Refer to [facility] DHHS information and follow, remain home.  Await testing results.  Follow up with your primary care physician this week as needed.   Return to Urgent care as needed.  Proceed directly to the emergency room for any increasing shortness of breath, chest pain or worsening concerns.

## 2018-09-05 NOTE — ED Triage Notes (Addendum)
Patient states she has been feeling bad since Saturday.SOB started today, patient has had chills and chest pain, headache today,  Patient was swabbed for COVID yesterday

## 2018-09-05 NOTE — ED Provider Notes (Signed)
[facility] ____________________________________________  Time seen: Approximately 5:34 PM  I have reviewed the triage vital signs and the nursing notes.   HISTORY  Chief Complaint Shortness of Breath   HPI Holly Hart is a 28 y.o. female presenting for evaluation of shortness of breath and chest pain.  Patient reports this past Saturday she overall started to not feel good.  States on Saturday she felt tired and run down with some aching.  States Sunday she began having aching chest pain in the middle the chest with accompanied shortness of breath.  States that shortness of breath is milder.  States it does hurt some to press on her chest.  Denies any injury.  Denies palpitations, extremity swelling or rash.  Does also have some accompanying scratchy throat and nasal congestion.  Denies known fevers.  Does report she recently was on vacation and her cousin that was with her ended up having fevers for several days unsure of diagnosis of illness.  Also reports she works with [employer] and frequently exposed to a lot of people.  Denies rash, vomiting, diarrhea, constipation, change in taste or smell.  Denies oral contraceptives.  Not a smoker.  Denies aggravating or alleviating factors otherwise.  Patient reports that yesterday [facility] and had COVID-19 testing performed.  Awaiting results.  Patient's last menstrual period was 08/31/2018. Denies pregnancy.    Past Medical History:  Diagnosis Date  . Costochondritis   . Pinched nerve in neck 2012  . Seasonal allergies   Asthma as a child.  Patient Active Problem List   Diagnosis Date Noted  . Hirsutism 02/01/2013  . Dermatitis 02/01/2013  . GERD (gastroesophageal reflux disease) 02/01/2013    History reviewed. No pertinent surgical history.   No current facility-administered medications for this encounter.   Current Outpatient Medications:  .  amoxicillin (AMOXIL) 875  MG tablet, Take 1 tablet (875 mg total) by mouth 2 (two) times daily., Disp: 20 tablet, Rfl: 0  Allergies Patient has no known allergies.  Family History  Problem Relation Age of Onset  . Diabetes Mother   . Hypertension Mother   . Diabetes Father     Social History Social History   Tobacco Use  . Smoking status: Never Smoker  . Smokeless tobacco: Never Used  Substance Use Topics  . Alcohol use: Yes    Comment: Couple drinks on the weekends.  . Drug use: No    Review of Systems Constitutional: No known fever. ENT: Positive sore throat. Cardiovascular: Positive chest pain. Respiratory: Positive shortness of breath. Gastrointestinal: No abdominal pain.  No nausea, no vomiting.  No diarrhea.  No constipation. Genitourinary: Negative for dysuria. Musculoskeletal: Some intermittent backache. Skin: Negative for rash. Neurological: Negative for focal weakness or numbness.   ____________________________________________   PHYSICAL EXAM:  VITAL SIGNS: ED Triage Vitals  Enc Vitals Group     BP 09/05/18 1640 132/89     Pulse Rate 09/05/18 1640 69     Resp 09/05/18 1640 16     Temp 09/05/18 1640 98 F (36.7 C)     Temp Source 09/05/18 1640 Oral     SpO2 09/05/18 1640 100 %     Weight 09/05/18 1634 205 lb (93 kg)     Height 09/05/18 1634 5\' 9"  (1.753 m)     Head Circumference --      Peak Flow --      Pain Score 09/05/18 1633 7  Pain Loc --      Pain Edu? --      Excl. in GC? --     Constitutional: Alert and oriented. Well appearing and in no acute distress. Eyes: Conjunctivae are normal.  Head: Atraumatic. No sinus tenderness to palpation. No swelling. No erythema.  Ears: no erythema, normal TMs bilaterally.   Nose:Nasal congestion with clear rhinorrhea  Mouth/Throat: Mucous membranes are moist. Mild pharyngeal erythema. No tonsillar swelling or exudate.  Neck: No stridor.  No cervical spine tenderness to palpation. Hematological/Lymphatic/Immunilogical: No  cervical lymphadenopathy. Cardiovascular: Normal rate, regular rhythm. Grossly normal heart sounds.  Good peripheral circulation. Respiratory: Normal respiratory effort.  No retractions. No wheezes, rales or rhonchi. Good air movement.  Gastrointestinal: Soft and nontender.  Musculoskeletal: Ambulatory with steady gait. No cervical, thoracic or lumbar tenderness to palpation.  Mid chest mild tenderness to direct palpation.  No lower extremity edema noted bilaterally. Neurologic:  Normal speech and language. No gait instability. Skin:  Skin appears warm, dry and intact. No rash noted. Psychiatric: Mood and affect are normal. Speech and behavior are normal. ___________________________________________   LABS (all labs ordered are listed, but only abnormal results are displayed)  Labs Reviewed  RAPID STREP SCREEN ([facility] ONLY) - Abnormal; Notable for the following components:      Result Value   Streptococcus, Group A Screen (Direct) POSITIVE (*)    All other components within normal limits   ____________________________________________  EKG  ED ECG REPORT I, Renford DillsLindsey Hershell Brandl, the attending provider, personally viewed and interpreted this ECG.   Date: 09/05/2018  EKG Time: 1728  Rate: 64  Rhythm: normal EKG, normal sinus rhythm  Axis: Normal  Intervals:none  ST&T Change: None  RADIOLOGY  Dg Chest 2 View  Result Date: 09/05/2018 CLINICAL DATA:  Chest pain and shortness of breath EXAM: CHEST - 2 VIEW COMPARISON:  April 17, 2015 FINDINGS: Lungs are clear. Heart size and pulmonary vascularity are normal. No adenopathy. No pneumothorax. There is slight upper thoracic levoscoliosis. IMPRESSION: No edema or consolidation. Electronically Signed   By: Bretta BangWilliam  Woodruff III M.D.   On: 09/05/2018 16:58   ____________________________________________   PROCEDURES   INITIAL IMPRESSION / ASSESSMENT AND PLAN / ED COURSE  Pertinent labs & imaging results that were available during  my care of the patient were reviewed by me and considered in my medical decision making (see chart for details).  Well-appearing patient.  No acute distress.  Chest tenderness reproducible by direct palpation.  Vital signs stable.  Lungs clear throughout.  EKG well-appearing.  Chest x-ray negative for acute cardiopulmonary changes.  Strep positive.  Will treat with oral amoxicillin.  Also suspect viral illness.  Patient has had COVID-19 testing and awaiting results.  Recommend rest, fluids, supportive care, over-the-counter Tylenol and ibuprofen.  Discussed strict reevaluation for any changes or worsening concerns.  Proceed directly to emergency room for worsening complaints.  Remain home and monitor self.  Patient states she has a work note already.  [facility] DHHS information given and instructed patient to follow.  Discussed follow up with Primary care physician this week by phone. Discussed follow up and return parameters including no resolution or any worsening concerns. Patient verbalized understanding and agreed to plan.   ____________________________________________   FINAL CLINICAL IMPRESSION(S) / ED DIAGNOSES  Final diagnoses:  SOB (shortness of breath)  Advice Given About Covid-19 Virus Infection  Viral illness  Atypical chest pain  Strep pharyngitis     ED Discharge Orders  Ordered    amoxicillin (AMOXIL) 875 MG tablet  2 times daily     09/05/18 1751           Note: This dictation was prepared with Dragon dictation along with smaller phrase technology. Any transcriptional errors that result from this process are unintentional.         Marylene Land, NP 09/05/18 1754

## 2018-10-14 ENCOUNTER — Ambulatory Visit
Admission: EM | Admit: 2018-10-14 | Discharge: 2018-10-14 | Disposition: A | Discharge status: Home / Self Care | Payer: Managed Care, Other (non HMO) | Attending: Family Medicine | Admitting: Family Medicine

## 2018-10-14 ENCOUNTER — Encounter: Payer: Self-pay | Admitting: Emergency Medicine

## 2018-10-14 ENCOUNTER — Other Ambulatory Visit: Payer: Self-pay

## 2018-10-14 DIAGNOSIS — L732 Hidradenitis suppurativa: Secondary | ICD-10-CM

## 2018-10-14 DIAGNOSIS — S29012A Strain of muscle and tendon of back wall of thorax, initial encounter: Secondary | ICD-10-CM | POA: Diagnosis not present

## 2018-10-14 MED ORDER — DOXYCYCLINE HYCLATE 100 MG PO TABS: 100.0000 mg | ORAL_TABLET | Freq: Two times a day (BID) | ORAL | 0 refills | Status: DC

## 2018-10-14 MED ORDER — MELOXICAM 15 MG PO TABS: 15.0000 mg | ORAL_TABLET | Freq: Every day | ORAL | 0 refills | Status: DC

## 2018-10-14 MED ORDER — CYCLOBENZAPRINE HCL 10 MG PO TABS: 10.0000 mg | ORAL_TABLET | Freq: Three times a day (TID) | ORAL | 0 refills | Status: DC | PRN

## 2018-10-14 MED ORDER — HYDROCODONE-ACETAMINOPHEN 5-325 MG PO TABS: ORAL_TABLET | ORAL | 0 refills | Status: DC

## 2018-10-14 NOTE — ED Triage Notes (Signed)
Patient c/o neck pain for the past 3 weeks.  Patient reports pain is worse when she turns her head to the left.  Patient also reports an abscess in both her armpits off and on for 3 months.  Patient reports some drainage. Patient denies fevers.

## 2018-10-14 NOTE — Discharge Instructions (Signed)
Heat to upper back/neck area Range of motion neck stretches  Warm compresses to axilla (underarm) skin

## 2018-10-14 NOTE — ED Provider Notes (Signed)
[facility]    CSN: [id number] Arrival date & time: 10/14/18  4008      History   Chief Complaint Chief Complaint  Patient presents with  . Neck Pain    HPI Holly Hart is a 28 y.o. female.   28 yo female with a c/o neck pain for the past 3 weeks, worse on the right side. Pain is worse when turning her head. Denies any falls or other traumatic injuries. Denies pain radiating or numbness/tingling.   Also c/o redness and tenderness to skin on axilla for months but worse the past 2-3 days. States she's had abscess in the past. Denies any drainage, fevers, chills.      Past Medical History:  Diagnosis Date  . Costochondritis   . Pinched nerve in neck 2012  . Seasonal allergies     Patient Active Problem List   Diagnosis Date Noted  . Hirsutism 02/01/2013  . Dermatitis 02/01/2013  . GERD (gastroesophageal reflux disease) 02/01/2013    History reviewed. No pertinent surgical history.  OB History    Gravida  0   Para  0   Term  0   Preterm  0   AB  0   Living  0     SAB  0   TAB  0   Ectopic  0   Multiple  0   Live Births               Home Medications    Prior to Admission medications   Medication Sig Start Date End Date Taking? Authorizing Provider  amoxicillin (AMOXIL) 875 MG tablet Take 1 tablet (875 mg total) by mouth 2 (two) times daily. 09/05/18   Marylene Land, NP  cyclobenzaprine (FLEXERIL) 10 MG tablet Take 1 tablet (10 mg total) by mouth 3 (three) times daily as needed for muscle spasms. 10/14/18   Norval Gable, MD  doxycycline (VIBRA-TABS) 100 MG tablet Take 1 tablet (100 mg total) by mouth 2 (two) times daily. 10/14/18   Norval Gable, MD  HYDROcodone-acetaminophen (NORCO/VICODIN) 5-325 MG tablet 1-2 tabs po bid prn 10/14/18   Norval Gable, MD  meloxicam (MOBIC) 15 MG tablet Take 1 tablet (15 mg total) by mouth daily. 10/14/18   Norval Gable, MD  esomeprazole (NEXIUM) 20 MG capsule Take 20 mg by mouth daily at  12 noon.  09/05/18  [provider]  fluticasone (FLONASE) 50 MCG/ACT nasal spray Place 2 sprays into both nostrils daily. 04/17/15 09/05/18  Delsa Grana, PA-C    Family History Family History  Problem Relation Age of Onset  . Diabetes Mother   . Hypertension Mother   . Diabetes Father     Social History Social History   Tobacco Use  . Smoking status: Never Smoker  . Smokeless tobacco: Never Used  Substance Use Topics  . Alcohol use: Yes    Comment: Couple drinks on the weekends.  . Drug use: No     Allergies   Patient has no known allergies.   Review of Systems Review of Systems   Physical Exam Triage Vital Signs ED Triage Vitals  Enc Vitals Group     BP 10/14/18 0955 131/75     Pulse Rate 10/14/18 0955 69     Resp 10/14/18 0955 16     Temp 10/14/18 0955 97.8 F (36.6 C)     Temp Source 10/14/18 0955 Oral     SpO2 10/14/18 0955 97 %     Weight 10/14/18 0952 203  lb (92.1 kg)     Height 10/14/18 0952 5\' 9"  (1.753 m)     Head Circumference --      Peak Flow --      Pain Score 10/14/18 0952 8     Pain Loc --      Pain Edu? --      Excl. in GC? --    No data found.  Updated Vital Signs BP 131/75 (BP Location: Left Arm)   Pulse 69   Temp 97.8 F (36.6 C) (Oral)   Resp 16   Ht 5\' 9"  (1.753 m)   Wt 92.1 kg   LMP 10/01/2018 (Exact Date)   SpO2 97%   BMI 29.98 kg/m   Visual Acuity Right Eye Distance:   Left Eye Distance:   Bilateral Distance:    Right Eye Near:   Left Eye Near:    Bilateral Near:     Physical Exam Vitals signs and nursing note reviewed.  Constitutional:      General: She is not in acute distress.    Appearance: She is not toxic-appearing.  Musculoskeletal:     Cervical back: She exhibits tenderness (on the right trapezius and paraspinous muscles) and spasm. She exhibits normal range of motion, no bony tenderness, no swelling, no edema, no deformity, no laceration, no pain and normal pulse.  Skin:    Comments: Scarring  with blanchable erythema and tenderness to palpation over the axillary skin; no drainage  Neurological:     Mental Status: She is alert.      UC Treatments / Results  Labs (all labs ordered are listed, but only abnormal results are displayed) Labs Reviewed - No data to display  EKG   Radiology No results found.  Procedures Procedures (including critical care time)  Medications Ordered in UC Medications - No data to display  Initial Impression / Assessment and Plan / UC Course  I have reviewed the triage vital signs and the nursing notes.  Pertinent labs & imaging results that were available during my care of the patient were reviewed by me and considered in my medical decision making (see chart for details).      Final Clinical Impressions(s) / UC Diagnoses   Final diagnoses:  Upper back strain, initial encounter  Hidradenitis suppurativa     Discharge Instructions     Heat to upper back/neck area Range of motion neck stretches  Warm compresses to axilla (underarm) skin     ED Prescriptions    Medication Sig Dispense Auth. Provider   doxycycline (VIBRA-TABS) 100 MG tablet Take 1 tablet (100 mg total) by mouth 2 (two) times daily. 20 tablet Payton Mccallumonty, Kallyn Demarcus, MD   cyclobenzaprine (FLEXERIL) 10 MG tablet Take 1 tablet (10 mg total) by mouth 3 (three) times daily as needed for muscle spasms. 30 tablet Payton Mccallumonty, Toluwanimi Radebaugh, MD   meloxicam (MOBIC) 15 MG tablet Take 1 tablet (15 mg total) by mouth daily. 30 tablet Payton Mccallumonty, Sondi Desch, MD   HYDROcodone-acetaminophen (NORCO/VICODIN) 5-325 MG tablet 1-2 tabs po bid prn 6 tablet Payton Mccallumonty, Teauna Dubach, MD      1. diagnosis reviewed with patient 2. rx as per orders above; reviewed possible side effects, interactions, risks and benefits  3. Recommend supportive treatment as above 4. Follow-up prn if symptoms worsen or don't improve   Controlled Substance Prescriptions [place] Controlled Substance Registry consulted? Not Applicable    Payton Mccallumonty, Lilee Aldea, MD 10/19/18 [phone]

## 2018-11-07 ENCOUNTER — Ambulatory Visit (INDEPENDENT_AMBULATORY_CARE_PROVIDER_SITE_OTHER): Payer: Managed Care, Other (non HMO) | Admitting: Family Medicine

## 2018-11-07 ENCOUNTER — Other Ambulatory Visit: Payer: Self-pay

## 2018-11-07 ENCOUNTER — Encounter: Payer: Self-pay | Admitting: Family Medicine

## 2018-11-07 VITALS — BP 120/92 | HR 76 | Ht 69.0 in | Wt 202.0 lb

## 2018-11-07 DIAGNOSIS — Z8719 Personal history of other diseases of the digestive system: Secondary | ICD-10-CM | POA: Diagnosis not present

## 2018-11-07 DIAGNOSIS — R1011 Right upper quadrant pain: Secondary | ICD-10-CM

## 2018-11-07 DIAGNOSIS — Z7689 Persons encountering health services in other specified circumstances: Secondary | ICD-10-CM

## 2018-11-07 DIAGNOSIS — L732 Hidradenitis suppurativa: Secondary | ICD-10-CM

## 2018-11-07 DIAGNOSIS — Z23 Encounter for immunization: Secondary | ICD-10-CM

## 2018-11-07 MED ORDER — CEPHALEXIN 500 MG PO CAPS: 500.0000 mg | ORAL_CAPSULE | Freq: Two times a day (BID) | ORAL | 1 refills | Status: DC

## 2018-11-07 MED ORDER — CHLORHEXIDINE GLUCONATE 4 % EX LIQD: Freq: Every day | CUTANEOUS | 0 refills | Status: DC | PRN

## 2018-11-07 NOTE — Progress Notes (Signed)
Date:  11/07/2018   Name:  Holly Hart   DOB:  08-05-1990   MRN:  [id number]   Chief Complaint: Establish Care and hydradenitis (needs ref to derm)  Patient is a 28 year old female who presents for an establish care exam. The patient reports the following problems: abd pain and hidradenitis. Health maintenance has been reviewed influenza vac.  Abdominal Pain This is a new problem. The current episode started more than 1 year ago. The onset quality is sudden. The problem occurs every several days. The problem has been waxing and waning. The pain is located in the RUQ. The pain is at a severity of 8/10. The quality of the pain is colicky. The abdominal pain does not radiate. Pertinent negatives include no anorexia, arthralgias, constipation, diarrhea, dysuria, fever, frequency, headaches, hematuria, myalgias, nausea or vomiting. Exacerbated by: certain foods/fried. Prior diagnostic workup includes ultrasound. Her past medical history is significant for gallstones.  Rash This is a chronic problem. The current episode started more than 1 year ago. Location: axillary. The rash is characterized by redness and pain. Pertinent negatives include no anorexia, congestion, cough, diarrhea, eye pain, facial edema, fatigue, fever, joint pain, rhinorrhea, shortness of breath, sore throat or vomiting. The treatment provided moderate relief.    Review of Systems  Constitutional: Negative.  Negative for chills, fatigue, fever and unexpected weight change.  HENT: Negative for congestion, ear discharge, ear pain, rhinorrhea, sinus pressure, sneezing and sore throat.   Eyes: Negative for photophobia, pain, discharge, redness and itching.  Respiratory: Negative for cough, chest tightness, shortness of breath, wheezing and stridor.   Cardiovascular: Negative for chest pain, palpitations and leg swelling.  Gastrointestinal: Negative for abdominal distention, abdominal pain, anal bleeding, anorexia, blood in  stool, constipation, diarrhea, nausea and vomiting.  Endocrine: Negative for cold intolerance, heat intolerance, polydipsia, polyphagia and polyuria.  Genitourinary: Negative for dysuria, flank pain, frequency, hematuria, menstrual problem, pelvic pain, urgency, vaginal bleeding and vaginal discharge.  Musculoskeletal: Negative for arthralgias, back pain, joint pain and myalgias.  Skin: Negative for rash.  Allergic/Immunologic: Negative for environmental allergies and food allergies.  Neurological: Negative for dizziness, weakness, light-headedness, numbness and headaches.  Hematological: Negative for adenopathy. Does not bruise/bleed easily.  Psychiatric/Behavioral: Negative for dysphoric mood. The patient is not nervous/anxious.     Patient Active Problem List   Diagnosis Date Noted  . Hirsutism 02/01/2013  . Dermatitis 02/01/2013  . GERD (gastroesophageal reflux disease) 02/01/2013    No Known Allergies  History reviewed. No pertinent surgical history.  Social History   Tobacco Use  . Smoking status: Never Smoker  . Smokeless tobacco: Never Used  Substance Use Topics  . Alcohol use: Yes    Comment: Couple drinks on the weekends.  . Drug use: No     Medication list has been reviewed and updated.  Current Meds  Medication Sig  . cyclobenzaprine (FLEXERIL) 10 MG tablet Take 1 tablet (10 mg total) by mouth 3 (three) times daily as needed for muscle spasms.    PHQ 2/9 Scores 11/07/2018  PHQ - 2 Score 0  PHQ- 9 Score 0    BP Readings from Last 3 Encounters:  11/07/18 (!) 120/92  10/14/18 131/75  09/05/18 132/89    Physical Exam Vitals signs reviewed.  Constitutional:      Appearance: She is well-developed.  HENT:     Head: Normocephalic.     Right Ear: Tympanic membrane, ear canal and external ear normal.     Left  Ear: Tympanic membrane, ear canal and external ear normal.     Nose: Nose normal. No congestion or rhinorrhea.  Eyes:     General: Lids are  everted, no foreign bodies appreciated. No scleral icterus.       Left eye: No foreign body or hordeolum.     Conjunctiva/sclera: Conjunctivae normal.     Right eye: Right conjunctiva is not injected.     Left eye: Left conjunctiva is not injected.     Pupils: Pupils are equal, round, and reactive to light.  Neck:     Musculoskeletal: Normal range of motion and neck supple.     Thyroid: No thyromegaly.     Vascular: No JVD.     Trachea: No tracheal deviation.  Cardiovascular:     Rate and Rhythm: Normal rate and regular rhythm.     Heart sounds: Normal heart sounds. No murmur. No friction rub. No gallop.   Pulmonary:     Effort: Pulmonary effort is normal. No respiratory distress.     Breath sounds: Normal breath sounds. No wheezing or rales.  Abdominal:     General: Bowel sounds are normal.     Palpations: Abdomen is soft. There is no mass.     Tenderness: There is no abdominal tenderness. There is no guarding or rebound.  Musculoskeletal: Normal range of motion.        General: No tenderness.  Lymphadenopathy:     Cervical: No cervical adenopathy.  Skin:    General: Skin is warm.     Coloration: Skin is not jaundiced or pale.     Findings: No bruising, erythema, lesion or rash.  Neurological:     Mental Status: She is alert and oriented to person, place, and time.     Cranial Nerves: No cranial nerve deficit.     Deep Tendon Reflexes: Reflexes normal.  Psychiatric:        Mood and Affect: Mood is not anxious or depressed.     Wt Readings from Last 3 Encounters:  11/07/18 202 lb (91.6 kg)  10/14/18 203 lb (92.1 kg)  09/05/18 205 lb (93 kg)    BP (!) 120/92   Pulse 76   Ht 5\' 9"  (1.753 m)   Wt 202 lb (91.6 kg)   LMP 10/31/2018 (Exact Date)   BMI 29.83 kg/m   Assessment and Plan:  1. Influenza vaccine needed Discussed and administered. - Flu Vaccine QUAD 6+ mos PF IM (Fluarix Quad PF)  2. Establishing care with new doctor, encounter for Patient establishing  care with new physician.  Patient's previous encounters have been reviewed as well as previous imaging ultrasound and labs.  3. Right upper quadrant abdominal pain Patient with history of right upper quadrant abdominal pain which is been episodic in nature and only 3 or 4 episodes over the past 2 years.  Ultrasound did note cholelithiasis however there was no thickening of the gallbladder wall.  Will check hepatic function panel and lipase to make sure there is no elevation of transaminase and no pancreatitis.  If there is an abnormality here next step is perhaps involving surgery otherwise we will keep watch. - Hepatic Function Panel (6) - Lipase  4. History of cholelithiasis Patient has a history of stones that were noted on ultrasound but there is no further evaluation needed to be of note unless patient becomes symptomatic. - Hepatic Function Panel (6)  5. Hidradenitis Patient has a history of axillary hidradenitis.  Patient did not do well  on doxycycline but seemed to do best on amoxicillin.  I suspect this was Augmentin because of the staph coverage and this would be difficult to continue on a regular basis.  We will initiate cephalexin 500 mg twice a day for a week and then back down to 1 a day in the meantime we will put in for dermatology consult for further evaluation and treatment.  Patient was also prescribed some Hibiclens to be used on a basis to clean underneath arms on a regular basis. - cephALEXin (KEFLEX) 500 MG capsule; Take 1 capsule (500 mg total) by mouth 2 (two) times daily.  Dispense: 60 capsule; Refill: 1 - chlorhexidine (HIBICLENS) 4 % external liquid; Apply topically daily as needed. Apply daily  Dispense: 120 mL; Refill: 0 - Ambulatory referral to Dermatology

## 2018-11-07 NOTE — Patient Instructions (Addendum)
Hidradenitis Suppurativa Hidradenitis suppurativa is a long-term (chronic) skin disease. It is similar to a severe form of acne, but it affects areas of the body where acne would be unusual, especially areas of the body where skin rubs against skin and becomes moist. These include:  Underarms.  Groin.  Genital area.  Buttocks.  Upper thighs.  Breasts. Hidradenitis suppurativa may start out as small lumps or pimples caused by blocked sweat glands or hair follicles. Pimples may develop into deep sores that break open (rupture) and drain pus. Over time, affected areas of skin may thicken and become scarred. This condition is rare and does not spread from person to person (non-contagious). What are the causes? The exact cause of this condition is not known. It may be related to:  Female and female hormones.  An overactive disease-fighting system (immune system). The immune system may over-react to blocked hair follicles or sweat glands and cause swelling and pus-filled sores. What increases the risk? You are more likely to develop this condition if you:  Are female.  Are 11-55 years old.  Have a family history of hidradenitis suppurativa.  Have a personal history of acne.  Are overweight.  Smoke.  Take the medicine lithium. What are the signs or symptoms? The first symptoms are usually painful bumps in the skin, similar to pimples. The condition may get worse over time (progress), or it may only cause mild symptoms. If the disease progresses, symptoms may include:  Skin bumps getting bigger and growing deeper into the skin.  Bumps rupturing and draining pus.  Itchy, infected skin.  Skin getting thicker and scarred.  Tunnels under the skin (fistulas) where pus drains from a bump.  Pain during daily activities, such as pain during walking if your groin area is affected.  Emotional problems, such as stress or depression. This condition may affect your appearance and your  ability or willingness to wear certain clothes or do certain activities. How is this diagnosed? This condition is diagnosed by a health care provider who specializes in skin diseases (dermatologist). You may be diagnosed based on:  Your symptoms and medical history.  A physical exam.  Testing a pus sample for infection.  Blood tests. How is this treated? Your treatment will depend on how severe your symptoms are. The same treatment will not work for everybody with this condition. You may need to try several treatments to find what works best for you. Treatment may include:  Cleaning and bandaging (dressing) your wounds as needed.  Lifestyle changes, such as new skin care routines.  Taking medicines, such as: ? Antibiotics. ? Acne medicines. ? Medicines to reduce the activity of the immune system. ? A diabetes medicine (metformin). ? Birth control pills, for women. ? Steroids to reduce swelling and pain.  Working with a mental health care provider, if you experience emotional distress due to this condition. If you have severe symptoms that do not get better with medicine, you may need surgery. Surgery may involve:  Using a laser to clear the skin and remove hair follicles.  Opening and draining deep sores.  Removing the areas of skin that are diseased and scarred. Follow these instructions at home: Medicines   Take over-the-counter and prescription medicines only as told by your health care provider.  If you were prescribed an antibiotic medicine, take it as told by your health care provider. Do not stop taking the antibiotic even if your condition improves. Skin care  If you have open wounds, cover   them with a clean dressing as told by your health care provider. Keep wounds clean by washing them gently with soap and water when you bathe.  Do not shave the areas where you get hidradenitis suppurativa.  Do not wear deodorant.  Wear loose-fitting clothes.  Try to avoid  getting overheated or sweaty. If you get sweaty or wet, change into clean, dry clothes as soon as you can.  To help relieve pain and itchiness, cover sore areas with a warm, clean washcloth (warm compress) for 5-10 minutes as often as needed.  If told by your health care provider, take a bleach bath twice a week: ? Fill your bathtub halfway with water. ? Pour in  cup of unscented household bleach. ? Soak in the tub for 5-10 minutes. ? Only soak from the neck down. Avoid water on your face and hair. ? Shower to rinse off the bleach from your skin. General instructions  Learn as much as you can about your disease so that you have an active role in your treatment. Work closely with your health care provider to find treatments that work for you.  If you are overweight, work with your health care provider to lose weight as recommended.  Do not use any products that contain nicotine or tobacco, such as cigarettes and e-cigarettes. If you need help quitting, ask your health care provider.  If you struggle with living with this condition, talk with your health care provider or work with a mental health care provider as recommended.  Keep all follow-up visits as told by your health care provider. This is important. Where to find more information  Hidradenitis Suppurativa Foundation, Inc.: https://www.hs-foundation.org/ Contact a health care provider if you have:  A flare-up of hidradenitis suppurativa.  A fever or chills.  Trouble controlling your symptoms at home.  Trouble doing your daily activities because of your symptoms.  Trouble dealing with emotional problems related to your condition. Summary  Hidradenitis suppurativa is a long-term (chronic) skin disease. It is similar to a severe form of acne, but it affects areas of the body where acne would be unusual.  The first symptoms are usually painful bumps in the skin, similar to pimples. The condition may get worse over time  (progress), or it may only cause mild symptoms.  If you have open wounds, cover them with a clean dressing as told by your health care provider. Keep wounds clean by washing them gently with soap and water when you bathe.  Besides skin care, treatment may include medicines, laser treatment, and surgery. This information is not intended to replace advice given to you by your health care provider. Make sure you discuss any questions you have with your health care provider. Document Released: 09/24/2003 Document Revised: 02/17/2017 Document Reviewed: 02/17/2017 Elsevier Patient Education  2020 [employer]. Chlorhexidine topical antiseptic What is this medicine? CHLORHEXIDINE (klor HEX i deen) is used as a skin wound cleanser and a general skin cleanser. This medicine is also used as a surgical hand scrub and to cleanse the skin before surgery to help prevent infections. This medicine may be used for other purposes; ask your health care provider or pharmacist if you have questions. COMMON BRAND NAME(S): Betasept, Chlorostat, Hibiclens What should I tell my health care provider before I take this medicine? They need to know if you have any of the following conditions: any skin rashes or problems an unusual or allergic reaction to chlorhexidine, other medicines, foods, dyes, or preservatives pregnant or trying  to get pregnant breast-feeding How should I use this medicine? This medicine is for external use only. Do not take by mouth. Follow the directions on the label or those given to you by your doctor or health care professional. Keep out of eyes, ears and mouth. This medicine should not be used as a preoperative skin preparation of the face or head. Talk to your pediatrician regarding the use of this medicine in children. While this medicine may be used for children for selected conditions, precautions do apply. Overdosage: If you think you have taken too much of this medicine contact a poison  control center or emergency room at once. NOTE: This medicine is only for you. Do not share this medicine with others. What if I miss a dose? This does not apply; this medicine is not for regular use. What may interact with this medicine? Interactions are not expected. This list may not describe all possible interactions. Give your health care provider a list of all the medicines, herbs, non-prescription drugs, or dietary supplements you use. Also tell them if you smoke, drink alcohol, or use illegal drugs. Some items may interact with your medicine. What should I watch for while using this medicine? This medicine may cause severe allergic reactions. Notify a health care professional right away if you think you are having an allergic reaction. Do not take this medicine by mouth. Avoid contact with your ears and eyes. If contact with the eyes occur, rinse the eyes well with plenty of cool tap water. What side effects may I notice from receiving this medicine? Side effects that you should report to your doctor or health care professional as soon as possible: allergic reactions like skin rash, itching or hives, swelling of the face, lips, or tongue breathing problems cough Side effects that usually do not require medical attention (report to your doctor or health care professional if they continue or are bothersome): increased sensitivity to the sun skin irritation This list may not describe all possible side effects. Call your doctor for medical advice about side effects. You may report side effects to FDA at 1-800-FDA-1088. Where should I keep my medicine? Keep out of the reach of children. Store at room temperature between 15 and 30 degrees C (59 and 86 degrees F). Store away from direct light and heat. Do not freeze. Throw away any unused medicine after the expiration date. NOTE: This sheet is a summary. It may not cover all possible information. If you have questions about this medicine, talk  to your doctor, pharmacist, or health care provider.  2020 Elsevier/Gold Standard (2015-03-13 20:48:32)

## 2019-05-06 ENCOUNTER — Ambulatory Visit: Payer: Managed Care, Other (non HMO) | Attending: Internal Medicine

## 2019-05-06 DIAGNOSIS — Z23 Encounter for immunization: Secondary | ICD-10-CM

## 2019-05-06 NOTE — Progress Notes (Signed)
   Covid-19 Vaccination Clinic  Name:  Nary Sneed    MRN: [id number] DOB: 06/27/1990  05/06/2019  Ms. Haynes Dage was observed post Covid-19 immunization for 15 minutes without incident. She was provided with Vaccine Information Sheet and instruction to access the V-Safe system.   Ms. Haynes Dage was instructed to call 911 with any severe reactions post vaccine: Marland Kitchen Difficulty breathing  . Swelling of face and throat  . A fast heartbeat  . A bad rash all over body  . Dizziness and weakness   Immunizations Administered    Name Date Dose VIS Date Route   Pfizer COVID-19 Vaccine 05/06/2019  2:53 PM 0.3 mL 02/03/2019 Intramuscular   Manufacturer: [employer], [employer]   Lot: [id number]   NDC: [address]-5

## 2019-05-31 ENCOUNTER — Ambulatory Visit: Payer: Managed Care, Other (non HMO) | Attending: Internal Medicine

## 2019-05-31 DIAGNOSIS — Z23 Encounter for immunization: Secondary | ICD-10-CM

## 2019-05-31 NOTE — Progress Notes (Signed)
   Covid-19 Vaccination Clinic  Name:  Holly Hart    MRN: [id number] DOB: 06/23/1990  05/31/2019  Ms. Holly Hart was observed post Covid-19 immunization for 15 minutes without incident. She was provided with Vaccine Information Sheet and instruction to access the V-Safe system.   Ms. Holly Hart was instructed to call 911 with any severe reactions post vaccine: Marland Kitchen Difficulty breathing  . Swelling of face and throat  . A fast heartbeat  . A bad rash all over body  . Dizziness and weakness   Immunizations Administered    Name Date Dose VIS Date Route   Pfizer COVID-19 Vaccine 05/31/2019 11:10 AM 0.3 mL 02/03/2019 Intramuscular   Manufacturer: [employer], [employer]   Lot: [id number]   NDC: [address]-2

## 2019-08-25 ENCOUNTER — Ambulatory Visit (INDEPENDENT_AMBULATORY_CARE_PROVIDER_SITE_OTHER): Payer: 59 | Admitting: Family Medicine

## 2019-08-25 ENCOUNTER — Encounter: Payer: Self-pay | Admitting: Family Medicine

## 2019-08-25 ENCOUNTER — Other Ambulatory Visit: Payer: Self-pay

## 2019-08-25 VITALS — BP 130/80 | HR 60 | Ht 69.0 in | Wt 206.0 lb

## 2019-08-25 DIAGNOSIS — N920 Excessive and frequent menstruation with regular cycle: Secondary | ICD-10-CM

## 2019-08-25 DIAGNOSIS — S161XXS Strain of muscle, fascia and tendon at neck level, sequela: Secondary | ICD-10-CM | POA: Diagnosis not present

## 2019-08-25 DIAGNOSIS — J0100 Acute maxillary sinusitis, unspecified: Secondary | ICD-10-CM | POA: Diagnosis not present

## 2019-08-25 MED ORDER — AMOXICILLIN 500 MG PO CAPS: 500.0000 mg | ORAL_CAPSULE | Freq: Three times a day (TID) | ORAL | 0 refills | Status: DC

## 2019-08-25 MED ORDER — CYCLOBENZAPRINE HCL 10 MG PO TABS: 10.0000 mg | ORAL_TABLET | Freq: Three times a day (TID) | ORAL | 2 refills | Status: DC | PRN

## 2019-08-25 NOTE — Progress Notes (Signed)
Date:  08/25/2019   Name:  Holly Hart   DOB:  August 19, 1990   MRN:  [id number]   Chief Complaint: Sinusitis (sinus pressure, drainage) and Neck Pain (wants refill on cyclobenzaprine)  Sinusitis This is a new problem. The current episode started in the past 7 days (Tuesday). The problem has been waxing and waning since onset. There has been no fever. The pain is moderate. Associated symptoms include neck pain. Pertinent negatives include no chills, congestion, coughing, diaphoresis, ear pain, headaches, hoarse voice, shortness of breath, sinus pressure, sneezing, sore throat or swollen glands. Past treatments include nothing. The treatment provided no relief.  Neck Pain  This is a chronic problem. The current episode started more than 1 year ago. The problem occurs intermittently. The problem has been gradually improving. The quality of the pain is described as aching. The pain is mild. Pertinent negatives include no fever, headaches, numbness, photophobia or weakness.    No results found for: CREATININE, BUN, NA, K, CL, CO2 No results found for: CHOL, HDL, LDLCALC, LDLDIRECT, TRIG, CHOLHDL No results found for: TSH No results found for: HGBA1C Lab Results  Component Value Date   HGB 12.3 02/01/2013   No results found for: ALT, AST, GGT, ALKPHOS, BILITOT   Review of Systems  Constitutional: Negative.  Negative for chills, diaphoresis, fatigue, fever and unexpected weight change.  HENT: Negative for congestion, ear discharge, ear pain, hoarse voice, rhinorrhea, sinus pressure, sneezing and sore throat.   Eyes: Negative for photophobia, pain, discharge, redness and itching.  Respiratory: Negative for cough, shortness of breath, wheezing and stridor.   Gastrointestinal: Negative for abdominal pain, blood in stool, constipation, diarrhea, nausea and vomiting.  Endocrine: Negative for cold intolerance, heat intolerance, polydipsia, polyphagia and polyuria.  Genitourinary: Negative for  dysuria, flank pain, frequency, hematuria, menstrual problem, pelvic pain, urgency, vaginal bleeding and vaginal discharge.  Musculoskeletal: Positive for neck pain. Negative for arthralgias, back pain and myalgias.  Skin: Negative for rash.  Allergic/Immunologic: Negative for environmental allergies and food allergies.  Neurological: Negative for dizziness, weakness, light-headedness, numbness and headaches.  Hematological: Negative for adenopathy. Does not bruise/bleed easily.  Psychiatric/Behavioral: Negative for dysphoric mood. The patient is not nervous/anxious.     Patient Active Problem List   Diagnosis Date Noted  . Hirsutism 02/01/2013  . Dermatitis 02/01/2013  . GERD (gastroesophageal reflux disease) 02/01/2013    No Known Allergies  No past surgical history on file.  Social History   Tobacco Use  . Smoking status: Never Smoker  . Smokeless tobacco: Never Used  Vaping Use  . Vaping Use: Never used  Substance Use Topics  . Alcohol use: Yes    Comment: Couple drinks on the weekends.  . Drug use: No     Medication list has been reviewed and updated.  Current Meds  Medication Sig  . chlorhexidine (HIBICLENS) 4 % external liquid Apply topically daily as needed. Apply daily  . cyclobenzaprine (FLEXERIL) 10 MG tablet Take 1 tablet (10 mg total) by mouth 3 (three) times daily as needed for muscle spasms.  Marland Kitchen HUMIRA PEN 40 MG/0.4ML PNKT SMARTSIG:40 Milligram(s) SUB-Q Once a Week    PHQ 2/9 Scores 08/25/2019 11/07/2018  PHQ - 2 Score 0 0  PHQ- 9 Score 0 0    GAD 7 : Generalized Anxiety Score 08/25/2019 11/07/2018  Nervous, Anxious, on Edge 0 0  Control/stop worrying 0 0  Worry too much - different things 0 0  Trouble relaxing 0 0  Restless 0  0  Easily annoyed or irritable 0 0  Afraid - awful might happen 0 0  Total GAD 7 Score 0 0    BP Readings from Last 3 Encounters:  08/25/19 130/80  11/07/18 (!) 120/92  10/14/18 131/75    Physical Exam Vitals and nursing  note reviewed.  Constitutional:      Appearance: She is well-developed.  HENT:     Head: Normocephalic.     Right Ear: Tympanic membrane and external ear normal.     Left Ear: Tympanic membrane and external ear normal.     Nose:     Right Turbinates: Not enlarged.     Left Turbinates: Not enlarged.     Right Sinus: Maxillary sinus tenderness present.     Left Sinus: Maxillary sinus tenderness present.     Mouth/Throat:     Lips: Pink.     Mouth: Mucous membranes are moist.     Pharynx: Oropharynx is clear. Uvula midline.  Eyes:     General: Lids are everted, no foreign bodies appreciated. No scleral icterus.       Left eye: No foreign body or hordeolum.     Conjunctiva/sclera: Conjunctivae normal.     Right eye: Right conjunctiva is not injected.     Left eye: Left conjunctiva is not injected.     Pupils: Pupils are equal, round, and reactive to light.  Neck:     Thyroid: No thyromegaly.     Vascular: No JVD.     Trachea: No tracheal deviation.  Cardiovascular:     Rate and Rhythm: Normal rate and regular rhythm.     Heart sounds: Normal heart sounds. No murmur heard.  No friction rub. No gallop.   Pulmonary:     Effort: Pulmonary effort is normal. No respiratory distress.     Breath sounds: Normal breath sounds. No wheezing, rhonchi or rales.  Abdominal:     General: Bowel sounds are normal.     Palpations: Abdomen is soft. There is no mass.     Tenderness: There is no abdominal tenderness. There is no guarding or rebound.  Musculoskeletal:        General: No tenderness. Normal range of motion.     Cervical back: Normal range of motion and neck supple. Spasms present.  Lymphadenopathy:     Cervical: No cervical adenopathy.  Skin:    General: Skin is warm.     Findings: No rash.  Neurological:     Mental Status: She is alert and oriented to person, place, and time.     Cranial Nerves: No cranial nerve deficit.     Deep Tendon Reflexes: Reflexes normal.  Psychiatric:         Mood and Affect: Mood is not anxious or depressed.     Wt Readings from Last 3 Encounters:  08/25/19 206 lb (93.4 kg)  11/07/18 202 lb (91.6 kg)  10/14/18 203 lb (92.1 kg)    BP 130/80   Pulse 60   Ht 5\' 9"  (1.753 m)   Wt 206 lb (93.4 kg)   LMP 08/23/2019 (Exact Date)   BMI 30.42 kg/m   Assessment and Plan:  1. Acute maxillary sinusitis, recurrence not specified Acute.  Persistent.  History and exam is consistent with maxillary sinusitis.  Will initiate amoxicillin 500 mg once a day for 10 days.  2. Strain of neck muscle, sequela Chronic.  Episodic.  Recurrent.  Currently stable.  Patient would like a refill of cyclobenzaprine 10 mg  to use primarily at night.  3. Menorrhagia with regular cycle Chronic.  Becoming more regular.  Patient is having menorrhagia particularly with heavy lifting.  Patient desires to go on a OCP for lessening the severity of the bleeding with periods.  Patient also has been hirsutism which may need to be taken in consideration with choice of OCP. - Ambulatory referral to Gynecology

## 2019-10-09 ENCOUNTER — Ambulatory Visit (INDEPENDENT_AMBULATORY_CARE_PROVIDER_SITE_OTHER): Payer: 59 | Admitting: Obstetrics and Gynecology

## 2019-10-09 ENCOUNTER — Other Ambulatory Visit (HOSPITAL_COMMUNITY)
Admission: RE | Admit: 2019-10-09 | Discharge: 2019-10-09 | Disposition: A | Discharge status: Home / Self Care | Payer: Managed Care, Other (non HMO) | Source: Ambulatory Visit | Attending: Obstetrics and Gynecology | Admitting: Obstetrics and Gynecology

## 2019-10-09 ENCOUNTER — Other Ambulatory Visit: Payer: Self-pay

## 2019-10-09 ENCOUNTER — Encounter: Payer: Self-pay | Admitting: Obstetrics and Gynecology

## 2019-10-09 VITALS — BP 126/70 | Ht 69.0 in | Wt 204.4 lb

## 2019-10-09 DIAGNOSIS — Z01419 Encounter for gynecological examination (general) (routine) without abnormal findings: Secondary | ICD-10-CM

## 2019-10-09 DIAGNOSIS — N920 Excessive and frequent menstruation with regular cycle: Secondary | ICD-10-CM

## 2019-10-09 DIAGNOSIS — Z1239 Encounter for other screening for malignant neoplasm of breast: Secondary | ICD-10-CM

## 2019-10-09 DIAGNOSIS — Z130 Encounter for screening for diseases of the blood and blood-forming organs and certain disorders involving the immune mechanism: Secondary | ICD-10-CM

## 2019-10-09 DIAGNOSIS — Z124 Encounter for screening for malignant neoplasm of cervix: Secondary | ICD-10-CM | POA: Insufficient documentation

## 2019-10-09 DIAGNOSIS — Z0000 Encounter for general adult medical examination without abnormal findings: Secondary | ICD-10-CM

## 2019-10-09 NOTE — Patient Instructions (Signed)

## 2019-10-11 LAB — CYTOLOGY - PAP
Comment: NEGATIVE
Diagnosis: NEGATIVE
High risk HPV: NEGATIVE

## 2019-10-13 LAB — CBC
Hematocrit: 34.6 % (ref 34.0–46.6)
Hemoglobin: 11.3 g/dL (ref 11.1–15.9)
MCH: 27.5 pg (ref 26.6–33.0)
MCHC: 32.7 g/dL (ref 31.5–35.7)
MCV: 84 fL (ref 79–97)
Platelets: 316 10*3/uL (ref 150–450)
RBC: 4.11 x10E6/uL (ref 3.77–5.28)
RDW: 13.1 % (ref 11.7–15.4)
WBC: 6.5 10*3/uL (ref 3.4–10.8)

## 2019-10-13 LAB — FERRITIN: Ferritin: 11 ng/mL — ABNORMAL LOW (ref 15–150)

## 2019-11-03 ENCOUNTER — Encounter: Payer: Self-pay | Admitting: Obstetrics and Gynecology

## 2019-11-03 ENCOUNTER — Other Ambulatory Visit: Payer: Self-pay

## 2019-11-03 ENCOUNTER — Other Ambulatory Visit: Payer: Self-pay | Admitting: Obstetrics and Gynecology

## 2019-11-03 ENCOUNTER — Ambulatory Visit (INDEPENDENT_AMBULATORY_CARE_PROVIDER_SITE_OTHER): Payer: 59

## 2019-11-03 ENCOUNTER — Ambulatory Visit (INDEPENDENT_AMBULATORY_CARE_PROVIDER_SITE_OTHER): Payer: 59 | Admitting: Obstetrics and Gynecology

## 2019-11-03 VITALS — BP 122/70 | Ht 69.0 in | Wt 204.6 lb

## 2019-11-03 DIAGNOSIS — N840 Polyp of corpus uteri: Secondary | ICD-10-CM | POA: Diagnosis not present

## 2019-11-03 DIAGNOSIS — N920 Excessive and frequent menstruation with regular cycle: Secondary | ICD-10-CM | POA: Diagnosis not present

## 2019-11-03 DIAGNOSIS — Z3040 Encounter for surveillance of contraceptives, unspecified: Secondary | ICD-10-CM | POA: Diagnosis not present

## 2019-11-03 LAB — POCT URINE PREGNANCY: Preg Test, Ur: NEGATIVE

## 2019-11-03 NOTE — Patient Instructions (Signed)
Etonogestrel implant What is this medicine? ETONOGESTREL (et oh noe JES trel) is a contraceptive (birth control) device. It is used to prevent pregnancy. It can be used for up to 3 years. This medicine may be used for other purposes; ask your health care provider or pharmacist if you have questions. COMMON BRAND NAME(S): Implanon, Nexplanon What should I tell my health care provider before I take this medicine? They need to know if you have any of these conditions:  abnormal vaginal bleeding  blood vessel disease or blood clots  breast, cervical, endometrial, ovarian, liver, or uterine cancer  diabetes  gallbladder disease  heart disease or recent heart attack  high blood pressure  high cholesterol or triglycerides  kidney disease  liver disease  migraine headaches  seizures  stroke  tobacco smoker  an unusual or allergic reaction to etonogestrel, anesthetics or antiseptics, other medicines, foods, dyes, or preservatives  pregnant or trying to get pregnant  breast-feeding How should I use this medicine? This device is inserted just under the skin on the inner side of your upper arm by a health care professional. Talk to your pediatrician regarding the use of this medicine in children. Special care may be needed. Overdosage: If you think you have taken too much of this medicine contact a poison control center or emergency room at once. NOTE: This medicine is only for you. Do not share this medicine with others. What if I miss a dose? This does not apply. What may interact with this medicine? Do not take this medicine with any of the following medications:  amprenavir  fosamprenavir This medicine may also interact with the following medications:  acitretin  aprepitant  armodafinil  bexarotene  bosentan  carbamazepine  certain medicines for fungal infections like fluconazole, ketoconazole, itraconazole and voriconazole  certain medicines to treat  hepatitis, HIV or AIDS  cyclosporine  felbamate  griseofulvin  lamotrigine  modafinil  oxcarbazepine  phenobarbital  phenytoin  primidone  rifabutin  rifampin  rifapentine  St. John's wort  topiramate This list may not describe all possible interactions. Give your health care provider a list of all the medicines, herbs, non-prescription drugs, or dietary supplements you use. Also tell them if you smoke, drink alcohol, or use illegal drugs. Some items may interact with your medicine. What should I watch for while using this medicine? This product does not protect you against HIV infection (AIDS) or other sexually transmitted diseases. You should be able to feel the implant by pressing your fingertips over the skin where it was inserted. Contact your doctor if you cannot feel the implant, and use a non-hormonal birth control method (such as condoms) until your doctor confirms that the implant is in place. Contact your doctor if you think that the implant may have broken or become bent while in your arm. You will receive a user card from your health care provider after the implant is inserted. The card is a record of the location of the implant in your upper arm and when it should be removed. Keep this card with your health records. What side effects may I notice from receiving this medicine? Side effects that you should report to your doctor or health care professional as soon as possible:  allergic reactions like skin rash, itching or hives, swelling of the face, lips, or tongue  breast lumps, breast tissue changes, or discharge  breathing problems  changes in emotions or moods  coughing up blood  if you feel that the implant   may have broken or bent while in your arm  high blood pressure  pain, irritation, swelling, or bruising at the insertion site  scar at site of insertion  signs of infection at the insertion site such as fever, and skin redness, pain or  discharge  signs and symptoms of a blood clot such as breathing problems; changes in vision; chest pain; severe, sudden headache; pain, swelling, warmth in the leg; trouble speaking; sudden numbness or weakness of the face, arm or leg  signs and symptoms of liver injury like dark yellow or brown urine; general ill feeling or flu-like symptoms; light-colored stools; loss of appetite; nausea; right upper belly pain; unusually weak or tired; yellowing of the eyes or skin  unusual vaginal bleeding, discharge Side effects that usually do not require medical attention (report to your doctor or health care professional if they continue or are bothersome):  acne  breast pain or tenderness  headache  irregular menstrual bleeding  nausea This list may not describe all possible side effects. Call your doctor for medical advice about side effects. You may report side effects to FDA at 1-800-FDA-1088. Where should I keep my medicine? This drug is given in a hospital or clinic and will not be stored at home. NOTE: This sheet is a summary. It may not cover all possible information. If you have questions about this medicine, talk to your doctor, pharmacist, or health care provider.  2020 Elsevier/Gold Standard (2018-11-22 11:33:04)  

## 2019-11-03 NOTE — Progress Notes (Signed)
  Nexplanon Insertion  Patient given informed consent, signed copy in the chart, time out was performed. Pregnancy test was negative. Appropriate time out taken.  Patient's left arm was prepped and draped in the usual sterile fashion.. The ruler used to measure and mark insertion area.  Pt was prepped with betadine swab and then injected with 3 cc of 2% lidocaine with epinephrine. Nexplanon removed form packaging,  Device confirmed in needle, then inserted full length of needle and withdrawn per handbook instructions.  Pt insertion site covered with steri-strip and a bandage.   Minimal blood loss.  Pt tolerated the procedure well.   Patient will follow up for a SIS for possible endometrial polyp.  She has heavy menstrual bleeding with a regular monthly cycle.   Adelene Idler MD, [facility], [facility] 11/03/2019 5:22 PM

## 2019-11-07 ENCOUNTER — Telehealth: Payer: Self-pay | Admitting: Obstetrics and Gynecology

## 2019-11-07 NOTE — Telephone Encounter (Signed)
Message left for patient to schedule SIS.

## 2019-12-25 ENCOUNTER — Ambulatory Visit: Payer: 59 | Admitting: Obstetrics and Gynecology

## 2019-12-25 ENCOUNTER — Ambulatory Visit: Payer: 59

## 2020-01-10 ENCOUNTER — Ambulatory Visit: Payer: 59

## 2020-01-10 ENCOUNTER — Telehealth: Payer: Self-pay | Admitting: Obstetrics and Gynecology

## 2020-01-10 ENCOUNTER — Ambulatory Visit: Payer: 59 | Admitting: Obstetrics and Gynecology

## 2020-01-10 NOTE — Telephone Encounter (Signed)
Called pt to adv that we do not have u/s tech today and that I have rescheduled her appt to the first available, 01/26/20 @ 11:00. Pt acknowledged change and adv that she still has not started her cycle.

## 2020-01-12 ENCOUNTER — Other Ambulatory Visit: Payer: Self-pay

## 2020-01-12 ENCOUNTER — Ambulatory Visit: Admission: EM | Admit: 2020-01-12 | Discharge: 2020-01-12 | Discharge status: Against Medical Advice | Payer: 59

## 2020-01-12 ENCOUNTER — Ambulatory Visit: Payer: 59 | Admitting: Obstetrics and Gynecology

## 2020-01-15 ENCOUNTER — Ambulatory Visit (INDEPENDENT_AMBULATORY_CARE_PROVIDER_SITE_OTHER): Payer: 59

## 2020-01-15 ENCOUNTER — Other Ambulatory Visit: Payer: Self-pay

## 2020-01-15 ENCOUNTER — Encounter: Payer: Self-pay | Admitting: Emergency Medicine

## 2020-01-15 ENCOUNTER — Ambulatory Visit
Admission: EM | Admit: 2020-01-15 | Discharge: 2020-01-15 | Disposition: A | Discharge status: Home / Self Care | Payer: 59

## 2020-01-15 DIAGNOSIS — Z23 Encounter for immunization: Secondary | ICD-10-CM | POA: Diagnosis not present

## 2020-01-15 DIAGNOSIS — M79671 Pain in right foot: Secondary | ICD-10-CM

## 2020-01-15 DIAGNOSIS — W208XXA Other cause of strike by thrown, projected or falling object, initial encounter: Secondary | ICD-10-CM

## 2020-01-15 DIAGNOSIS — S9031XA Contusion of right foot, initial encounter: Secondary | ICD-10-CM | POA: Diagnosis not present

## 2020-01-15 HISTORY — DX: Hidradenitis suppurativa: L73.2

## 2020-01-15 MED ORDER — IBUPROFEN 800 MG PO TABS: 800.0000 mg | ORAL_TABLET | Freq: Three times a day (TID) | ORAL | 0 refills | Status: AC | PRN
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
## 2020-01-15 NOTE — Discharge Instructions (Signed)
X-rays are negative for fracture. Ice the area every couple of hours and you can take the ibuprofen and/or Tylenol for pain relief. F/u as needed for any new/worsening symptoms

## 2020-01-26 ENCOUNTER — Ambulatory Visit: Payer: 59

## 2020-01-26 ENCOUNTER — Ambulatory Visit: Payer: 59 | Admitting: Obstetrics and Gynecology

## 2020-02-27 ENCOUNTER — Telehealth: Payer: Self-pay

## 2020-02-27 NOTE — Telephone Encounter (Unsigned)
Copied from CRM [id number]. Topic: Quick Communication - See Telephone Encounter >> Feb 27, 2020  8:45 AM Aretta Nip wrote: CRM for notification. See Telephone encounter for: 02/27/20.Pls FU with Pt called this am with symptoms, not wanting to disclose much info at all just wanted dr to order her a covid, strep and flu test. Did not see the point in an appt  when wanted orders.   Very upset could not get covid appt at office, Did sch her a covid at [facility] for 1/6. She wants office to call her as to ordering the other 2 test. Marilynne Halsted and suggest CRM, Pt call back [phone]

## 2020-02-27 NOTE — Telephone Encounter (Signed)
I tried to call pt back- could not leave a message and am not sure what other 2 tests she is referring to

## 2020-02-29 ENCOUNTER — Other Ambulatory Visit: Payer: 59

## 2020-03-03 ENCOUNTER — Other Ambulatory Visit: Payer: Self-pay

## 2020-03-03 ENCOUNTER — Encounter: Payer: Self-pay | Admitting: Emergency Medicine

## 2020-03-03 ENCOUNTER — Ambulatory Visit
Admission: EM | Admit: 2020-03-03 | Discharge: 2020-03-03 | Disposition: A | Discharge status: Home / Self Care | Payer: BC Managed Care – PPO | Attending: Sports Medicine | Admitting: Sports Medicine

## 2020-03-03 DIAGNOSIS — U071 COVID-19: Secondary | ICD-10-CM | POA: Insufficient documentation

## 2020-03-03 DIAGNOSIS — R079 Chest pain, unspecified: Secondary | ICD-10-CM | POA: Diagnosis not present

## 2020-03-03 DIAGNOSIS — Z20822 Contact with and (suspected) exposure to covid-19: Secondary | ICD-10-CM | POA: Diagnosis present

## 2020-03-03 NOTE — Discharge Instructions (Signed)
See attached instructions.

## 2020-03-03 NOTE — ED Provider Notes (Signed)
[facility]    CSN: [id number] Arrival date & time: 03/03/20  0817      History   Chief Complaint Chief Complaint  Patient presents with  . Covid Exposure  . Nasal Congestion  . Headache  . Chest Pain    HPI Holly Hart is a 30 y.o. female.   Pleasant 30 year old female who presents for evaluation of 3 days of chest tightness and URI symptoms.  She has had positive sick contact.  No fever shakes chills.  No nausea vomiting diarrhea.  She has had associated headache chest congestion and some chest discomfort.  No ear pain or sore throat.  No shortness of breath.  She has been vaccinated against COVID and influenza and has had her COVID booster.  No red flag signs or symptoms offered by the patient.     Past Medical History:  Diagnosis Date  . Costochondritis   . Hidradenitis   . Pinched nerve in neck 2012  . Seasonal allergies     Patient Active Problem List   Diagnosis Date Noted  . Hirsutism 02/01/2013  . Dermatitis 02/01/2013  . GERD (gastroesophageal reflux disease) 02/01/2013    Past Surgical History:  Procedure Laterality Date  . NO PAST SURGERIES      OB History    Gravida  0   Para  0   Term  0   Preterm  0   AB  0   Living  0     SAB  0   IAB  0   Ectopic  0   Multiple  0   Live Births               Home Medications    Prior to Admission medications   Medication Sig Start Date End Date Taking? Authorizing Provider  cyclobenzaprine (FLEXERIL) 10 MG tablet Take 1 tablet (10 mg total) by mouth 3 (three) times daily as needed for muscle spasms. 08/25/19  Yes Duanne Limerick, MD  etonogestrel (NEXPLANON) 68 MG IMPL implant 1 each by Subdermal route once.   Yes [provider]  Ferrous Sulfate (IRON PO) Take 1 tablet by mouth daily.   Yes [provider]  HUMIRA PEN 40 MG/0.4ML PNKT SMARTSIG:40 Milligram(s) SUB-Q Once a Week 07/27/19  Yes [provider]  Multiple Vitamin  (MULTIVITAMIN) tablet Take 1 tablet by mouth daily.   Yes [provider]  Multiple Vitamins-Minerals (ZINC PO) Take 1 tablet by mouth daily.   Yes [provider]  Vitamin D, Ergocalciferol, (DRISDOL) 1.25 MG (50000 UNIT) CAPS capsule Take 50,000 Units by mouth 2 (two) times a week. 02/23/20  Yes [provider]  esomeprazole (NEXIUM) 20 MG capsule Take 20 mg by mouth daily at 12 noon.  09/05/18  [provider]  fluticasone (FLONASE) 50 MCG/ACT nasal spray Place 2 sprays into both nostrils daily. 04/17/15 09/05/18  Danelle Berry, PA-C    Family History Family History  Problem Relation Age of Onset  . Diabetes Mother   . Hypertension Mother   . Diabetes Father   . Stroke Maternal Grandfather   . Cancer Paternal Grandmother   . Heart disease Paternal Grandfather   . Hypertension Paternal Grandfather     Social History Social History   Tobacco Use  . Smoking status: Never Smoker  . Smokeless tobacco: Never Used  Vaping Use  . Vaping Use: Never used  Substance Use Topics  . Alcohol use: Yes    Comment: Couple drinks on  the weekends.  . Drug use: No     Allergies   Banana   Review of Systems Review of Systems  Constitutional: Negative for activity change, appetite change, chills, diaphoresis, fatigue and fever.  HENT: Negative for congestion, ear pain, rhinorrhea, sinus pressure, sinus pain and sore throat.   Respiratory: Positive for chest tightness. Negative for apnea, cough, shortness of breath, wheezing and stridor.   All other systems reviewed and are negative.    Physical Exam Triage Vital Signs ED Triage Vitals  Enc Vitals Group     BP 03/03/20 0834 (!) 142/84     Pulse Rate 03/03/20 0834 79     Resp 03/03/20 0834 18     Temp 03/03/20 0834 98.5 F (36.9 C)     Temp Source 03/03/20 0834 Oral     SpO2 03/03/20 0834 99 %     Weight 03/03/20 0836 201 lb (91.2 kg)     Height 03/03/20 0836 5\' 9"  (1.753 m)     Head Circumference  --      Peak Flow --      Pain Score 03/03/20 0835 6     Pain Loc --      Pain Edu? --      Excl. in GC? --    No data found.  Updated Vital Signs BP (!) 142/84 (BP Location: Left Arm)   Pulse 79   Temp 98.5 F (36.9 C) (Oral)   Resp 18   Ht 5\' 9"  (1.753 m)   Wt 91.2 kg   LMP 02/11/2020 (Approximate) Comment: Nexplanon  SpO2 99%   BMI 29.68 kg/m   Visual Acuity Right Eye Distance:   Left Eye Distance:   Bilateral Distance:    Right Eye Near:   Left Eye Near:    Bilateral Near:     Physical Exam Vitals and nursing note reviewed.  Constitutional:      General: She is not in acute distress.    Appearance: She is well-developed. She is not ill-appearing, toxic-appearing or diaphoretic.  HENT:     Head: Normocephalic and atraumatic.     Mouth/Throat:     Mouth: Mucous membranes are moist.     Pharynx: Oropharynx is clear.  Eyes:     Extraocular Movements: Extraocular movements intact.     Pupils: Pupils are equal, round, and reactive to light.  Cardiovascular:     Rate and Rhythm: Normal rate and regular rhythm.     Heart sounds: Normal heart sounds. No murmur heard. No friction rub. No gallop.   Pulmonary:     Effort: Pulmonary effort is normal. No respiratory distress.     Breath sounds: Normal breath sounds. No stridor. No wheezing, rhonchi or rales.  Musculoskeletal:     Cervical back: Normal range of motion and neck supple. No rigidity.  Neurological:     Mental Status: She is alert.     GCS: GCS eye subscore is 4. GCS verbal subscore is 5. GCS motor subscore is 6.  Psychiatric:        Mood and Affect: Mood normal.        Behavior: Behavior normal.      UC Treatments / Results  Labs (all labs ordered are listed, but only abnormal results are displayed) Labs Reviewed  SARS CORONAVIRUS 2 (TAT 6-24 HRS)    EKG  EKG done in the office today at 8:49 AM March 03, 2020.  Shows normal sinus rhythm no acute ST or T wave changes.  Ventricular rate was 71  bpm.  PR interval QRS duration were within normal limits.   Radiology No results found.  Procedures Procedures (including critical care time)  Medications Ordered in UC Medications - No data to display  Initial Impression / Assessment and Plan / UC Course  I have reviewed the triage vital signs and the nursing notes.  Pertinent labs & imaging results that were available during my care of the patient were reviewed by me and considered in my medical decision making (see chart for details).  Clinical impression: 30 year old female with 3 days of chest discomfort and congestion with associated headache.  She has been exposed to COVID.  She is fully vaccinated.  There may be a component of anxiety.  Her EKG is normal.   Treatment plan: 1.  The findings and treatment plan were discussed in detail with the patient.  Patient was in agreement. 2.  EKG as above.  No concerns for cardiac chest discomfort. 3.  Sent off a COVID test.  The rapid tests are out of stock.  We will send to the hospital which will take between 6 and 24 hours to come back.  I did indicate to her that she needs to assume that she is positive until she has a documented negative test.  Offered her a work note she declined. 3.  Did indicate to her that she will get called if she has a positive test but that she needs to log into MyChart for any negative test. 4.  Supportive care, plenty of fluids, plenty of rest.  She needs to quarantine until she has a negative test.  Tylenol or Motrin for fever or discomfort.  Educational handout was provided. 5.  Follow-up here as needed.    Final Clinical Impressions(s) / UC Diagnoses   Final diagnoses:  Chest pain, unspecified type  Exposure to COVID-19 virus     Discharge Instructions     See attached instructions.    ED Prescriptions    None     PDMP not reviewed this encounter.   Delton See, MD 03/03/20 [phone]

## 2020-03-03 NOTE — ED Triage Notes (Addendum)
Patient in today c/o headache x 1 week, nasal congestion x 2 days and chest pressure/pain x 3 days. Patient has had a covid exposure from her wife. Patient tested 02/27/19 and was negative for covid, but her wife was positive. Patient has taken OTC allergy medicine. Patient denies fever. Patient has been vaccinated against covid.

## 2020-03-04 LAB — SARS CORONAVIRUS 2 (TAT 6-24 HRS): SARS Coronavirus 2: POSITIVE — AB

## 2020-05-31 IMAGING — CR CHEST - 2 VIEW
2 series · 2 of 2 positions shown · non-contrast
Comparison: April 17, 2015

CLINICAL DATA: Chest pain and shortness of breath

EXAM:
CHEST - 2 VIEW

[chest pa]
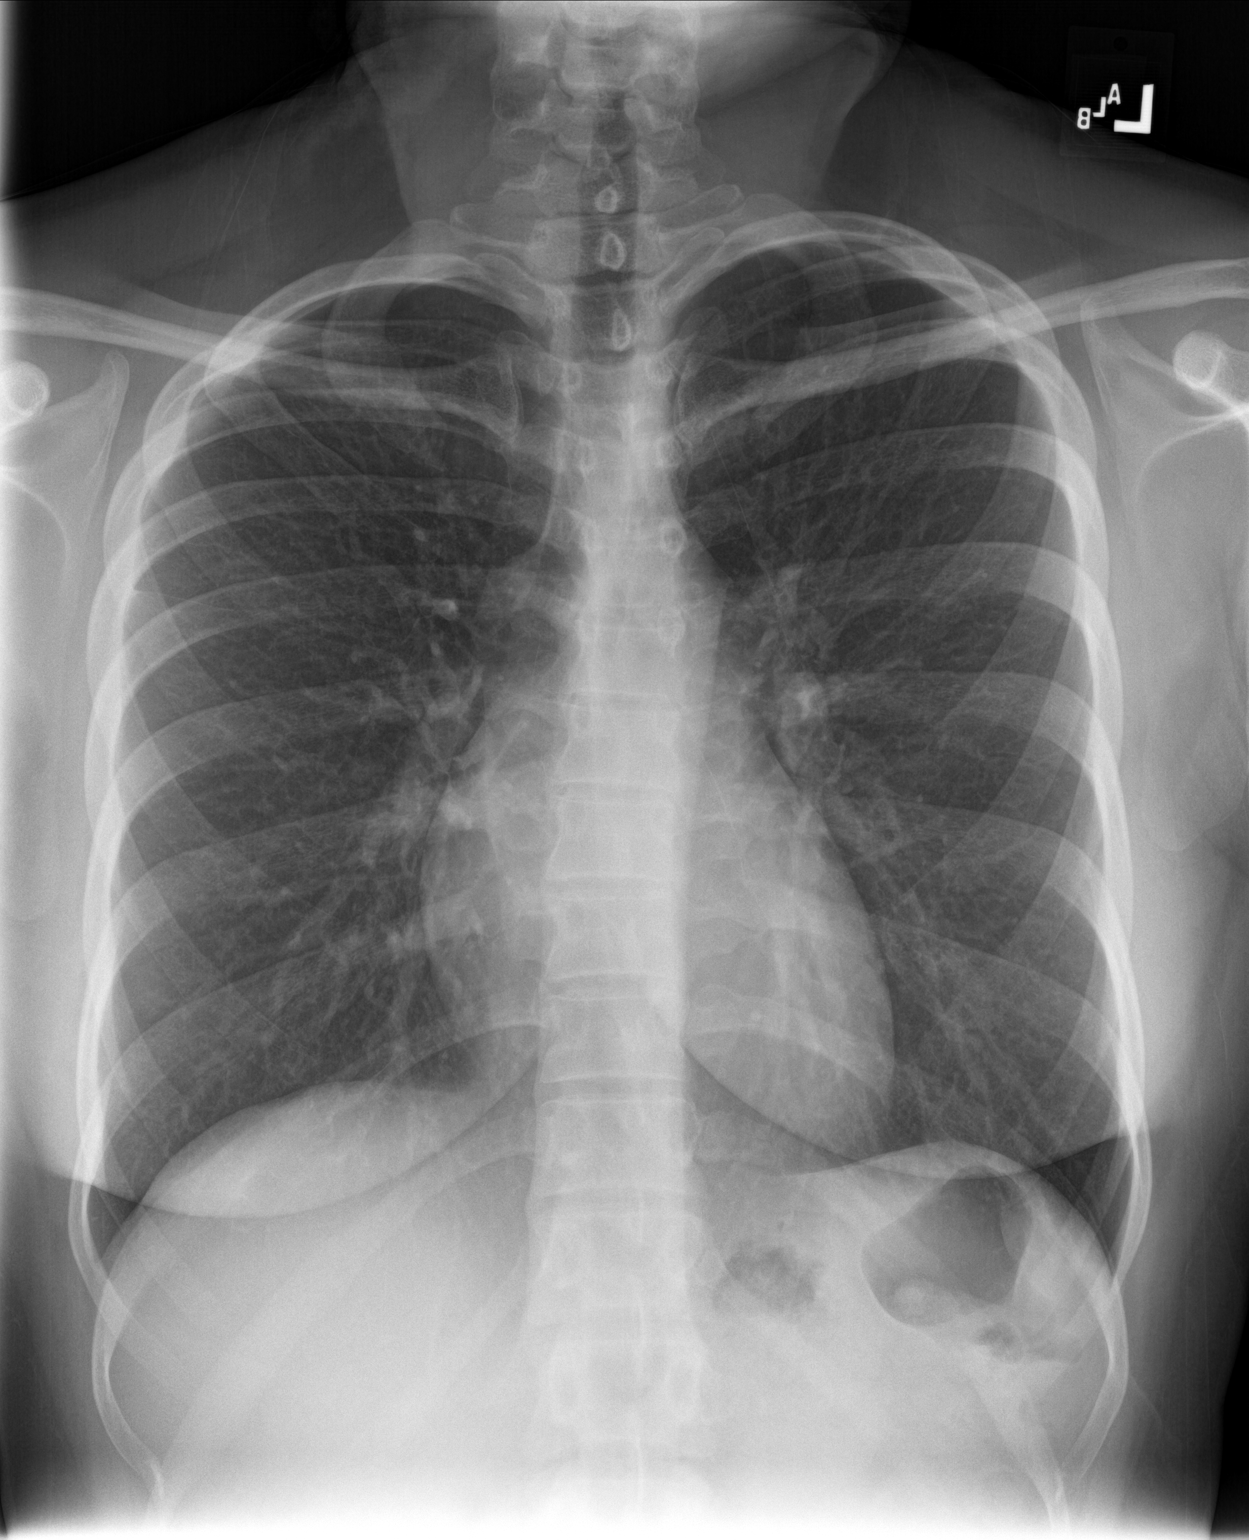

[chest lat]
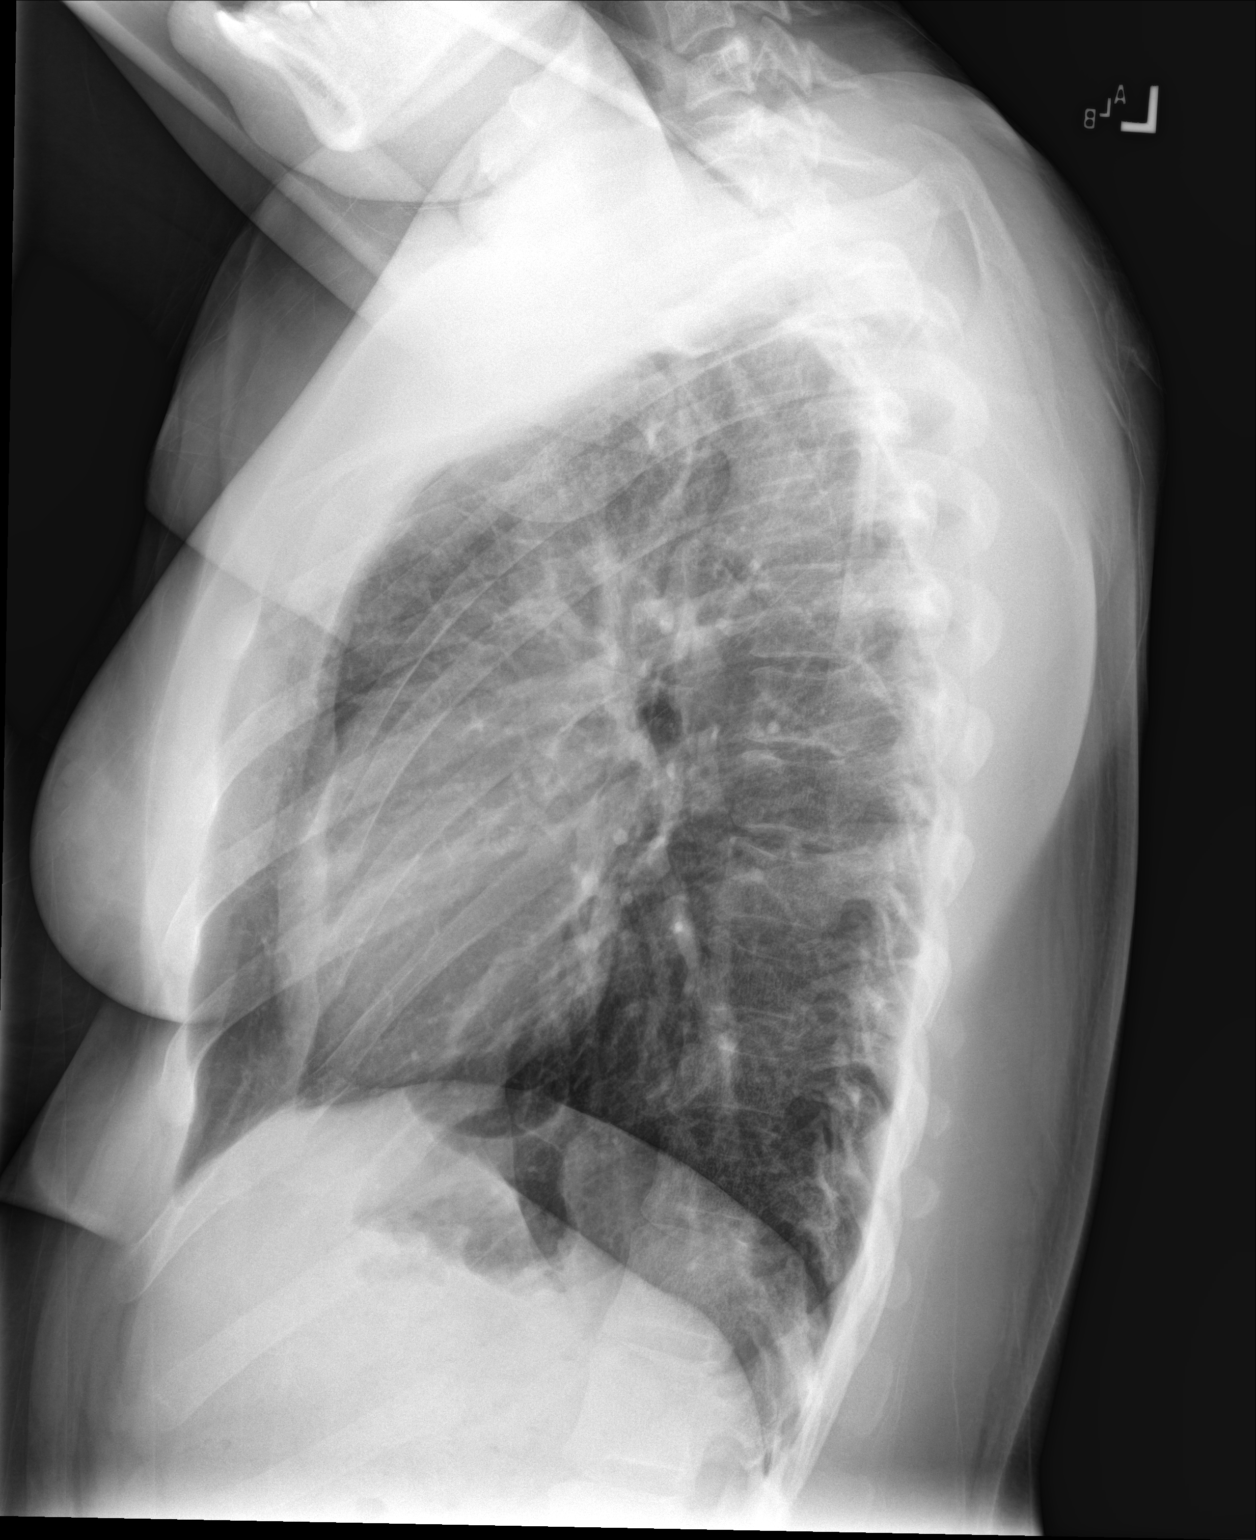

[2 of 2 positions shown; findings below may reference images not displayed]

FINDINGS: Lungs are clear. Heart size and pulmonary vascularity are normal. No
adenopathy. No pneumothorax. There is slight upper thoracic
levoscoliosis.
IMPRESSION: No edema or consolidation.

## 2020-06-19 NOTE — Progress Notes (Signed)
   Patient ID: Holly Hart, female   DOB: 1990/09/20, 30 y.o.   MRN: [id number]  Reason for Consult: Gynecologic Exam   Referred by Duanne Limerick, MD  Subjective:     HPI:  Holly Hart is a 30 y.o. female. She has been having problems with heavy uterine bleeidng during her menstrual cycle  Gynecological History  Patient's last menstrual period was 09/24/2019.   History of fibroids, polyps, or ovarian cysts? : no  History of PCOS? no Hstory of Endometriosis? no History of abnormal pap smears? no Have you had any sexually transmitted infections in the past? no   Past Medical History:  Diagnosis Date  . Costochondritis   . Hidradenitis   . Pinched nerve in neck 2012  . Seasonal allergies    Family History  Problem Relation Age of Onset  . Diabetes Mother   . Hypertension Mother   . Diabetes Father   . Stroke Maternal Grandfather   . Cancer Paternal Grandmother   . Heart disease Paternal Grandfather   . Hypertension Paternal Grandfather    Past Surgical History:  Procedure Laterality Date  . NO PAST SURGERIES      Short Social History:  Social History   Tobacco Use  . Smoking status: Never Smoker  . Smokeless tobacco: Never Used  Substance Use Topics  . Alcohol use: Yes    Comment: Couple drinks on the weekends.    Allergies  Allergen Reactions  . Banana Swelling    Current Outpatient Medications  Medication Sig Dispense Refill  . cyclobenzaprine (FLEXERIL) 10 MG tablet Take 1 tablet (10 mg total) by mouth 3 (three) times daily as needed for muscle spasms. 30 tablet 2  . etonogestrel (NEXPLANON) 68 MG IMPL implant 1 each by Subdermal route once.    . Ferrous Sulfate (IRON PO) Take 1 tablet by mouth daily.    Marland Kitchen HUMIRA PEN 40 MG/0.4ML PNKT SMARTSIG:40 Milligram(s) SUB-Q Once a Week    . Multiple Vitamin (MULTIVITAMIN) tablet Take 1 tablet by mouth daily.    . Multiple Vitamins-Minerals (ZINC PO) Take 1 tablet by mouth  daily.    . Vitamin D, Ergocalciferol, (DRISDOL) 1.25 MG (50000 UNIT) CAPS capsule Take 50,000 Units by mouth 2 (two) times a week.     No current facility-administered medications for this visit.    REVIEW OF SYSTEMS      Objective:  Objective   Vitals:   10/09/19 1607  BP: 126/70  Weight: 204 lb 6.4 oz (92.7 kg)  Height: 5\' 9"  (1.753 m)   Body mass index is 30.18 kg/m.  Physical Exam  Assessment/Plan:     30 yo with menorrhagia Reviewed medical options for management of menorrhagia Patient will consider.  Follow up for pelvic 38 Labs today for evaluation of possible anemia secondary to chronic blood loss.  Pap smear today    [place] MD [facility], [facility] 06/19/2020 2:35 PM

## 2020-11-22 ENCOUNTER — Ambulatory Visit: Payer: 59 | Admitting: Obstetrics and Gynecology

## 2020-12-10 ENCOUNTER — Ambulatory Visit (INDEPENDENT_AMBULATORY_CARE_PROVIDER_SITE_OTHER): Payer: BC Managed Care – PPO | Admitting: Obstetrics and Gynecology

## 2020-12-10 ENCOUNTER — Other Ambulatory Visit: Payer: Self-pay

## 2020-12-10 ENCOUNTER — Encounter: Payer: Self-pay | Admitting: Obstetrics and Gynecology

## 2020-12-10 VITALS — BP 130/70 | Ht 69.0 in | Wt 210.0 lb

## 2020-12-10 DIAGNOSIS — Z3046 Encounter for surveillance of implantable subdermal contraceptive: Secondary | ICD-10-CM | POA: Diagnosis not present

## 2020-12-10 NOTE — Progress Notes (Signed)
Patient ID: Holly Hart, female   DOB: September 02, 1990, 30 y.o.   MRN: [id number]  Reason for Consult: Procedure (Remove Nexplanon)   Referred by Duanne Limerick, MD  Subjective:     HPI:  Holly Hart is a 30 y.o. female. She has a Nexplanon in but it has become bothersome and sore in her arm.   Gynecological History  No LMP recorded.  Past Medical History:  Diagnosis Date   Costochondritis    Hidradenitis    Pinched nerve in neck 2012   Seasonal allergies    Family History  Problem Relation Age of Onset   Diabetes Mother    Hypertension Mother    Diabetes Father    Stroke Maternal Grandfather    Cancer Paternal Grandmother    Heart disease Paternal Grandfather    Hypertension Paternal Grandfather    Past Surgical History:  Procedure Laterality Date   NO PAST SURGERIES      Short Social History:  Social History   Tobacco Use   Smoking status: Never   Smokeless tobacco: Never  Substance Use Topics   Alcohol use: Yes    Comment: Couple drinks on the weekends.    Allergies  Allergen Reactions   Banana Swelling    Current Outpatient Medications  Medication Sig Dispense Refill   etonogestrel (NEXPLANON) 68 MG IMPL implant 1 each by Subdermal route once.     Ferrous Sulfate (IRON PO) Take 1 tablet by mouth daily.     HUMIRA PEN 40 MG/0.4ML PNKT SMARTSIG:40 Milligram(s) SUB-Q Once a Week     Multiple Vitamin (MULTIVITAMIN) tablet Take 1 tablet by mouth daily.     Multiple Vitamins-Minerals (ZINC PO) Take 1 tablet by mouth daily.     Vitamin D, Ergocalciferol, (DRISDOL) 1.25 MG (50000 UNIT) CAPS capsule Take 50,000 Units by mouth 2 (two) times a week.     cyclobenzaprine (FLEXERIL) 10 MG tablet Take 1 tablet (10 mg total) by mouth 3 (three) times daily as needed for muscle spasms. (Patient not taking: Reported on 12/10/2020) 30 tablet 2   No current facility-administered medications for this visit.    Review of Systems   Constitutional: Negative for chills, fatigue, fever and unexpected weight change.  HENT: Negative for trouble swallowing.  Eyes: Negative for loss of vision.  Respiratory: Negative for cough, shortness of breath and wheezing.  Cardiovascular: Negative for chest pain, leg swelling, palpitations and syncope.  GI: Negative for abdominal pain, blood in stool, diarrhea, nausea and vomiting.  GU: Negative for difficulty urinating, dysuria, frequency and hematuria.  Musculoskeletal: Negative for back pain, leg pain and joint pain.  Skin: Negative for rash.  Neurological: Negative for dizziness, headaches, light-headedness, numbness and seizures.  Psychiatric: Negative for behavioral problem, confusion, depressed mood and sleep disturbance.       Objective:  Objective   Vitals:   12/10/20 1511  BP: 130/70  Weight: 210 lb (95.3 kg)  Height: 5\' 9"  (1.753 m)   Body mass index is 31.01 kg/m.  Physical Exam Vitals and nursing note reviewed. Exam conducted with a chaperone present.  Constitutional:      Appearance: Normal appearance.  HENT:     Head: Normocephalic and atraumatic.  Eyes:     Extraocular Movements: Extraocular movements intact.     Pupils: Pupils are equal, round, and reactive to light.  Cardiovascular:     Rate and Rhythm: Normal rate and regular rhythm.  Pulmonary:     Effort: Pulmonary effort is normal.  Breath sounds: Normal breath sounds.  Abdominal:     General: Abdomen is flat.     Palpations: Abdomen is soft.  Musculoskeletal:     Cervical back: Normal range of motion.  Skin:    General: Skin is warm and dry.  Neurological:     General: No focal deficit present.     Mental Status: She is alert and oriented to person, place, and time.  Psychiatric:        Behavior: Behavior normal.        Thought Content: Thought content normal.        Judgment: Judgment normal.   Nexplanon Removal  Procedure Note Removal: Appropriate time out taken. Patient placed  in dorsal supine with left arm above head, elbow flexed at 90 degrees, arm resting on examination table.  The Nexplanon was noted in the patient's arm and the end was identified. The skin was cleansed with a Betadine solution. A small injection of subcutaneous lidocaine with epinephrine was given over the end of the implant. An incision was made at the end of the implant. The rod was noted in the incision and grasped with a hemostat. It was noted to be intact.  The incision was covered dressed with a steri strip and band aid before applying  a kerlex bandage pressure dressing. Minimal blood loss was noted during the procedure.  The patient tolerated the procedure well.    Assessment/Plan:    30 yo here for  Nexplanon removal today  More than 10 minutes were spent face to face with the patient in the room, reviewing the medical record, labs and images, and coordinating care for the patient. The plan of management was discussed in detail and counseling was provided.    Adelene Idler MD [facility], [facility] 12/10/2020 3:22 PM

## 2020-12-10 NOTE — Patient Instructions (Signed)
Hysteroscopy  Hysteroscopy is a procedure used to look inside a woman's womb (uterus). This may be done for various reasons, including:  To look for tumors and other growths in the uterus.  To evaluate abnormal bleeding, fibroid tumors, polyps, scar tissue, or uterine cancer.  To determine why a woman is unable to get pregnant or has had repeated pregnancy losses.  To locate an IUD (intrauterine device).  To place a birth control device into the fallopian tubes.  During this procedure, a thin, flexible tube with a small light and camera (hysteroscope) is used to examine the uterus. The camera sends images to a monitor in the room so that your health care provider can view the inside of your uterus. A hysteroscopy should be done right after a menstrual period.  Tell a health care provider about:  Any allergies you have.  All medicines you are taking, including vitamins, herbs, eye drops, creams, and over-the-counter medicines.  Any problems you or family members have had with anesthetic medicines.  Any blood disorders you have.  Any surgeries you have had.  Any medical conditions you have.  Whether you are pregnant or may be pregnant.  Whether you have been diagnosed with an STI (sexually transmitted infection) or you think you have an STI.  What are the risks?  Generally, this is a safe procedure. However, problems may occur, including:  Excessive bleeding.  Infection.  Damage to the uterus or other structures or organs.  Allergic reaction to medicines or fluids that are used in the procedure.  What happens before the procedure?  Staying hydrated  Follow instructions from your health care provider about hydration, which may include:  Up to 2 hours before the procedure - you may continue to drink clear liquids, such as water, clear fruit juice, black coffee, and plain tea.  Eating and drinking restrictions  Follow instructions from your health care provider about eating and drinking, which may include:  8 hours  before the procedure - stop eating solid foods and drink clear liquids only.  2 hours before the procedure - stop drinking clear liquids.  Medicines  Ask your health care provider about:  Changing or stopping your regular medicines. This is especially important if you are taking diabetes medicines or blood thinners.  Taking medicines such as aspirin and ibuprofen. These medicines can thin your blood. Do not take these medicines unless your health care provider tells you to take them.  Taking over-the-counter medicines, vitamins, herbs, and supplements.  Medicine may be placed in your cervix the day before the procedure. This medicine causes the cervix to open (dilate). The larger opening makes it easier for the hysteroscope to be inserted into the uterus during the procedure.  General instructions  Ask your health care provider:  What steps will be taken to help prevent infection. These steps may include:  Washing skin with a germ-killing soap.  Taking antibiotic medicine.  Do not use any products that contain nicotine or tobacco for at least 4 weeks before the procedure. These products include cigarettes, chewing tobacco, and vaping devices, such as e-cigarettes. If you need help quitting, ask your health care provider.  Plan to have a responsible adult take you home from the hospital or clinic.  Plan to have a responsible adult care for you for the time you are told after you leave the hospital or clinic. This is important.  Empty your bladder before the procedure begins.  What happens during the procedure?  An IV   will be inserted into one of your veins.  You may be given:  A medicine to help you relax (sedative).  A medicine that numbs the area around the cervix (local anesthetic).  A medicine to make you fall asleep (general anesthetic).  A hysteroscope will be inserted through your vagina and into your uterus.  Air or fluid will be used to enlarge your uterus to allow your health care provider to see it better.  The amount of fluid used will be carefully checked throughout the procedure.  In some cases, tissue may be gently scraped from inside the uterus and sent to a lab for testing (biopsy).  The procedure may vary among health care providers and hospitals.  What can I expect after the procedure?  Your blood pressure, heart rate, breathing rate, and blood oxygen level will be monitored until you leave the hospital or clinic.  You may have cramps. You may be given medicines for this.  You may have bleeding, which may vary from light spotting to menstrual-like bleeding. This is normal.  If you had a biopsy, it is up to you to get the results. Ask your health care provider, or the department that is doing the procedure, when your results will be ready.  Follow these instructions at home:  Activity  Rest as told by your health care provider.  Return to your normal activities as told by your health care provider. Ask your health care provider what activities are safe for you.  If you were given a sedative during the procedure, it can affect you for several hours. Do not drive or operate machinery until your health care provider says that it is safe.  Medicines  Do not take aspirin or other NSAIDs during recovery, as told by your healthcare provider. It can increase the risk of bleeding.  Ask your health care provider if the medicine prescribed to you:  Requires you to avoid driving or using machinery.  Can cause constipation. You may need to take these actions to prevent or treat constipation:  Drink enough fluid to keep your urine pale yellow.  Take over-the-counter or prescription medicines.  Eat foods that are high in fiber, such as beans, whole grains, and fresh fruits and vegetables.  Limit foods that are high in fat and processed sugars, such as fried or sweet foods.  General instructions  Do not douche, use tampons, or have sex for 2 weeks after the procedure, or until your health care provider approves.  Do not take  baths, swim, or use a hot tub until your health care provider approves. Take showers instead of baths for 2 weeks, or for as long as told by your health care provider.  Keep all follow-up visits. This is important.  Contact a health care provider if:  You feel dizzy or lightheaded.  You feel nauseous.  You have abnormal vaginal discharge.  You have a rash.  You have pain that does not get better with medicine.  You have chills.  Get help right away if:  You have bleeding that is heavier than a normal menstrual period.  You have a fever.  You have pain or cramps that get worse.  You develop new abdominal pain.  You faint.  You have pain in your shoulder.  You are short of breath.  Summary  Hysteroscopy is a procedure that is used to look inside a woman's womb (uterus).  After the procedure, you may have bleeding, which varies from light spotting to   menstrual-like bleeding. This is normal. You may also have cramps.  Do not douche, use tampons, or have sex for 2 weeks after the procedure, or until your health care provider approves.  Plan to have a responsible adult take you home from the hospital or clinic.  This information is not intended to replace advice given to you by your health care provider. Make sure you discuss any questions you have with your health care provider.  Document Revised: 09/27/2019 Document Reviewed: 09/27/2019  Elsevier Patient Education  2022 [employer]

## 2021-07-28 ENCOUNTER — Encounter: Payer: Self-pay | Admitting: Otolaryngology

## 2021-07-30 NOTE — Discharge Instructions (Signed)
[facility] ENDOSCOPIC SINUS SURGERY [facility]  What is Functional Endoscopic Sinus Surgery?  The Surgery involves making the natural openings of the sinuses larger by removing the bony partitions that separate the sinuses from the nasal cavity.  The natural sinus lining is preserved as much as possible to allow the sinuses to resume normal function after the surgery.  In some patients nasal polyps (excessively swollen lining of the sinuses) may be removed to relieve obstruction of the sinus openings.  The surgery is performed through the nose using lighted scopes, which eliminates the need for incisions on the face.  A septoplasty is a different procedure which is sometimes performed with sinus surgery.  It involves straightening the boy partition that separates the two sides of your nose.  A crooked or deviated septum may need repair if is obstructing the sinuses or nasal airflow.  Turbinate reduction is also often performed during sinus surgery.  The turbinates are bony proturberances from the side walls of the nose which swell and can obstruct the nose in patients with sinus and allergy problems.  Their size can be surgically reduced to help relieve nasal obstruction.  What Can Sinus Surgery Do For Me?  Sinus surgery can reduce the frequency of sinus infections requiring antibiotic treatment.  This can provide improvement in nasal congestion, post-nasal drainage, facial pressure and nasal obstruction.  Surgery will NOT prevent you from ever having an infection again, so it usually only for patients who get infections 4 or more times yearly requiring antibiotics, or for infections that do not clear with antibiotics.  It will not cure nasal allergies, so patients with allergies may still require medication to treat their allergies after surgery. Surgery may improve headaches related to sinusitis, however, some people will continue to  require medication to control sinus headaches related to allergies.  Surgery will do nothing for other forms of headache (migraine, tension or cluster).  What Are the Risks of Endoscopic Sinus Surgery?  Current techniques allow surgery to be performed safely with little risk, however, there are rare complications that patients should be aware of.  Because the sinuses are located around the eyes, there is risk of eye injury, including blindness, though again, this would be quite rare. This is usually a result of bleeding behind the eye during surgery, which can effect vision, though there are treatments to protect the vision and prevent permanent injury. More serious complications would include bleeding inside the brain cavity or damage to the brain.This happens when the fluid around the brain leaks out into the sinus cavity.  Again, all of these complications are uncommon, and spinal fluid leaks can be safely managed surgically if they occur.  The most common complication of sinus surgery is bleeding from the nose, which may require packing or cauterization of the nose.  Patients with polyps may experience recurrence of the polyps that would require revision surgery.  Alterations of sense of smell or injury to the tear ducts are also rare complications.   What is the Surgery Like, and what is the Recovery?  The Surgery usually takes a couple of hours to perform, and is usually performed under a general anesthetic (completely asleep).  Patients are usually discharged home after a couple of hours.  Sometimes during surgery it is necessary to pack the nose to control bleeding, and the packing is left in place for 24 - 48 hours, and removed by your surgeon.  If   a septoplasty was performed during the procedure, there is often a splint placed which must be removed after 5-7 days.   Discomfort: Pain is usually mild to moderate, and can be controlled by prescription pain medication or acetaminophen (Tylenol).   Aspirin, Ibuprofen (Advil, Motrin), or Naprosyn (Aleve) should be avoided, as they can cause increased bleeding.  Most patients feel sinus pressure like they have a bad head cold for several days.  Sleeping with your head elevated can help reduce swelling and facial pressure, as can ice packs over the face.  A humidifier may be helpful to keep the mucous and blood from drying in the nose.   Diet: There are no specific diet restrictions, however, you should generally start with clear liquids and a light diet of bland foods because the anesthetic can cause some nausea.  Advance your diet depending on how your stomach feels.  Taking your pain medication with food will often help reduce stomach upset which pain medications can cause.  Nasal Saline Irrigation: It is important to remove blood clots and dried mucous from the nose as it is healing.  This is done by having you irrigate the nose at least 3 - 4 times daily with a salt water solution.  We recommend using NeilMed Sinus Rinse (available at the drug store).  Fill the squeeze bottle with the solution, bend over a sink, and insert the tip of the squeeze bottle into the nose  of an inch.  Point the tip of the squeeze bottle towards the inside corner of the eye on the same side your irrigating.  Squeeze the bottle and gently irrigate the nose.  If you bend forward as you do this, most of the fluid will flow back out of the nose, instead of down your throat.   The solution should be warm, near body temperature, when you irrigate.   Each time you irrigate, you should use a full squeeze bottle.   Note that if you are instructed to use Nasal Steroid Sprays at any time after your surgery, irrigate with saline BEFORE using the steroid spray, so you do not wash it all out of the nose. Another product, Nasal Saline Gel (such as AYR Nasal Saline Gel) can be applied in each nostril 3 - 4 times daily to moisture the nose and reduce scabbing or crusting.  Bleeding:   Bloody drainage from the nose can be expected for several days, and patients are instructed to irrigate their nose frequently with salt water to help remove mucous and blood clots.  The drainage may be dark red or brown, though some fresh blood may be seen intermittently, especially after irrigation.  Do not blow you nose, as bleeding may occur. If you must sneeze, keep your mouth open to allow air to escape through your mouth.  If heavy bleeding occurs: Irrigate the nose with saline to rinse out clots, then spray the nose 3 - 4 times with Afrin Nasal Decongestant Spray.  The spray will constrict the blood vessels to slow bleeding.  Pinch the lower half of your nose shut to apply pressure, and lay down with your head elevated.  Ice packs over the nose may help as well. If bleeding persists despite these measures, you should notify your doctor.  Do not use the Afrin routinely to control nasal congestion after surgery, as it can result in worsening congestion and may affect healing.     Activity: Return to work varies among patients. Most patients will be out   of work at least 5 - 7 days to recover.  Patient may return to work after they are off of narcotic pain medication, and feeling well enough to perform the functions of their job.  Patients must avoid heavy lifting (over 10 pounds) or strenuous physical for 2 weeks after surgery, so your employer may need to assign you to light duty, or keep you out of work longer if light duty is not possible.  NOTE: you should not drive, operate dangerous machinery, do any mentally demanding tasks or make any important legal or financial decisions while on narcotic pain medication and recovering from the general anesthetic.    Call Your Doctor Immediately if You Have Any of the Following: Bleeding that you cannot control with the above measures Loss of vision, double vision, bulging of the eye or black eyes. Fever over 101 degrees Neck stiffness with severe headache,  fever, nausea and change in mental state. You are always encouraged to call anytime with concerns, however, please call with requests for pain medication refills during office hours.  Office Endoscopy: During follow-up visits your doctor will remove any packing or splints that may have been placed and evaluate and clean your sinuses endoscopically.  Topical anesthetic will be used to make this as comfortable as possible, though you may want to take your pain medication prior to the visit.  How often this will need to be done varies from patient to patient.  After complete recovery from the surgery, you may need follow-up endoscopy from time to time, particularly if there is concern of recurrent infection or nasal polyps.  

## 2021-08-07 ENCOUNTER — Ambulatory Visit: Payer: BC Managed Care – PPO | Admitting: Anesthesiology

## 2021-08-07 ENCOUNTER — Encounter
Admission: RE | Disposition: A | Discharge status: Home / Self Care | Payer: Self-pay | Source: Home / Self Care | Attending: Otolaryngology

## 2021-08-07 ENCOUNTER — Other Ambulatory Visit: Payer: Self-pay

## 2021-08-07 ENCOUNTER — Encounter: Payer: Self-pay | Admitting: Otolaryngology

## 2021-08-07 ENCOUNTER — Ambulatory Visit
Admission: RE | Admit: 2021-08-07 | Discharge: 2021-08-07 | Disposition: A | Discharge status: Home / Self Care | Payer: BC Managed Care – PPO | Attending: Otolaryngology | Admitting: Otolaryngology

## 2021-08-07 DIAGNOSIS — J339 Nasal polyp, unspecified: Secondary | ICD-10-CM | POA: Diagnosis not present

## 2021-08-07 DIAGNOSIS — J343 Hypertrophy of nasal turbinates: Secondary | ICD-10-CM | POA: Diagnosis not present

## 2021-08-07 DIAGNOSIS — J342 Deviated nasal septum: Secondary | ICD-10-CM | POA: Insufficient documentation

## 2021-08-07 DIAGNOSIS — J320 Chronic maxillary sinusitis: Secondary | ICD-10-CM | POA: Insufficient documentation

## 2021-08-07 DIAGNOSIS — K219 Gastro-esophageal reflux disease without esophagitis: Secondary | ICD-10-CM | POA: Diagnosis not present

## 2021-08-07 HISTORY — PX: POLYPECTOMY: SHX149

## 2021-08-07 HISTORY — PX: IMAGE GUIDED SINUS SURGERY: SHX6570

## 2021-08-07 HISTORY — PX: SEPTOPLASTY: SHX2393

## 2021-08-07 HISTORY — PX: MAXILLARY ANTROSTOMY: SHX2003

## 2021-08-07 HISTORY — PX: NASAL TURBINATE REDUCTION: SHX2072

## 2021-08-07 LAB — POCT PREGNANCY, URINE: Preg Test, Ur: NEGATIVE

## 2021-08-07 SURGERY — SINUS SURGERY, WITH IMAGING GUIDANCE
Anesthesia: General | Site: Nose | Laterality: Right

## 2021-08-07 MED ORDER — LACTATED RINGERS IV SOLN: INTRAVENOUS | Status: DC

## 2021-08-07 MED ORDER — DEXTROSE 5 % IV SOLN
2000.0000 mg | Freq: Once | INTRAVENOUS | Status: AC
  Administered 2021-08-07: 2000 mg via INTRAVENOUS

## 2021-08-07 MED ORDER — FENTANYL CITRATE (PF) 100 MCG/2ML IJ SOLN
INTRAMUSCULAR | Status: DC | PRN
Start: 2021-08-07 — End: 2021-08-07
  Administered 2021-08-07 (×2): 25 ug via INTRAVENOUS
  Administered 2021-08-07: 50 ug via INTRAVENOUS

## 2021-08-07 MED ORDER — ACETAMINOPHEN 10 MG/ML IV SOLN
1000.0000 mg | Freq: Once | INTRAVENOUS | Status: AC
  Administered 2021-08-07: 1000 mg via INTRAVENOUS

## 2021-08-07 MED ORDER — GLYCOPYRROLATE 0.2 MG/ML IJ SOLN
INTRAMUSCULAR | Status: DC | PRN
  Administered 2021-08-07: .1 mg via INTRAVENOUS

## 2021-08-07 MED ORDER — CEPHALEXIN 500 MG PO CAPS: 500.0000 mg | ORAL_CAPSULE | Freq: Two times a day (BID) | ORAL | 0 refills | Status: DC

## 2021-08-07 MED ORDER — OXYCODONE HCL 5 MG/5ML PO SOLN
5.0000 mg | Freq: Once | ORAL | Status: AC | PRN
  Administered 2021-08-07: 5 mg via ORAL

## 2021-08-07 MED ORDER — SUCCINYLCHOLINE CHLORIDE 200 MG/10ML IV SOSY
PREFILLED_SYRINGE | INTRAVENOUS | Status: DC | PRN
  Administered 2021-08-07: 100 mg via INTRAVENOUS

## 2021-08-07 MED ORDER — LIDOCAINE HCL (CARDIAC) PF 100 MG/5ML IV SOSY
PREFILLED_SYRINGE | INTRAVENOUS | Status: DC | PRN
  Administered 2021-08-07: 40 mg via INTRAVENOUS

## 2021-08-07 MED ORDER — SCOPOLAMINE 1 MG/3DAYS TD PT72
1.0000 | MEDICATED_PATCH | Freq: Once | TRANSDERMAL | Status: DC
  Administered 2021-08-07: 1.5 mg via TRANSDERMAL

## 2021-08-07 MED ORDER — PROPOFOL 10 MG/ML IV BOLUS
INTRAVENOUS | Status: DC | PRN
  Administered 2021-08-07: 150 mg via INTRAVENOUS

## 2021-08-07 MED ORDER — PREDNISONE 10 MG PO TABS: ORAL_TABLET | ORAL | 0 refills | Status: DC

## 2021-08-07 MED ORDER — HYDROCODONE-ACETAMINOPHEN 5-325 MG PO TABS: 1.0000 | ORAL_TABLET | Freq: Four times a day (QID) | ORAL | 0 refills | Status: AC | PRN

## 2021-08-07 MED ORDER — LIDOCAINE-EPINEPHRINE 1 %-1:100000 IJ SOLN
INTRAMUSCULAR | Status: DC | PRN
  Administered 2021-08-07: 1 mL
  Administered 2021-08-07: 7 mL

## 2021-08-07 MED ORDER — ONDANSETRON HCL 4 MG/2ML IJ SOLN: 4.0000 mg | Freq: Once | INTRAMUSCULAR | Status: DC | PRN

## 2021-08-07 MED ORDER — ONDANSETRON HCL 4 MG/2ML IJ SOLN
INTRAMUSCULAR | Status: DC | PRN
  Administered 2021-08-07: 4 mg via INTRAVENOUS

## 2021-08-07 MED ORDER — PHENYLEPHRINE HCL 0.5 % NA SOLN
NASAL | Status: DC | PRN
  Administered 2021-08-07: 30 mL via TOPICAL

## 2021-08-07 MED ORDER — OXYCODONE HCL 5 MG PO TABS: 5.0000 mg | ORAL_TABLET | Freq: Once | ORAL | Status: AC | PRN

## 2021-08-07 MED ORDER — OXYMETAZOLINE HCL 0.05 % NA SOLN
2.0000 | Freq: Once | NASAL | Status: AC
  Administered 2021-08-07: 2 via NASAL

## 2021-08-07 MED ORDER — DEXAMETHASONE SODIUM PHOSPHATE 4 MG/ML IJ SOLN
INTRAMUSCULAR | Status: DC | PRN
  Administered 2021-08-07: 10 mg via INTRAVENOUS

## 2021-08-07 MED ORDER — DEXMEDETOMIDINE (PRECEDEX) IN NS 20 MCG/5ML (4 MCG/ML) IV SYRINGE
PREFILLED_SYRINGE | INTRAVENOUS | Status: DC | PRN
  Administered 2021-08-07: 10 ug via INTRAVENOUS

## 2021-08-07 MED ORDER — FENTANYL CITRATE PF 50 MCG/ML IJ SOSY: 25.0000 ug | PREFILLED_SYRINGE | INTRAMUSCULAR | Status: DC | PRN

## 2021-08-07 MED ORDER — MIDAZOLAM HCL 5 MG/5ML IJ SOLN
INTRAMUSCULAR | Status: DC | PRN
  Administered 2021-08-07: 2 mg via INTRAVENOUS

## 2021-08-07 SURGICAL SUPPLY — 41 items
BLADE SHAVER TRUDI 4 15 DEG (ENT DISPOSABLE) ×2 IMPLANT
BLADE SHAVER TRUDI STR 4 (ENT DISPOSABLE) ×3 IMPLANT
CABLE TRUDI DISPOSABLE (ENT DISPOSABLE) ×10 IMPLANT
CANISTER SUCT 1200ML W/VALVE (MISCELLANEOUS) ×5 IMPLANT
CATH IV 18X1 1/4 SAFELET (CATHETERS) ×3 IMPLANT
COAGULATOR SUCT 8FR VV (MISCELLANEOUS) ×5 IMPLANT
DEVICE INFLATION SEID (MISCELLANEOUS) IMPLANT
DRAPE HEAD BAR (DRAPES) ×5 IMPLANT
ELECT REM PT RETURN 9FT ADLT (ELECTROSURGICAL) ×5
ELECTRODE REM PT RTRN 9FT ADLT (ELECTROSURGICAL) ×4 IMPLANT
GLOVE SURG GAMMEX PI TX LF 7.5 (GLOVE) ×12 IMPLANT
GOWN STRL REUS W/ TWL LRG LVL3 (GOWN DISPOSABLE) ×4 IMPLANT
GOWN STRL REUS W/TWL LRG LVL3 (GOWN DISPOSABLE) ×5
IV CATH 18X1 1/4 SAFELET (CATHETERS)
IV NS 500ML (IV SOLUTION) ×5
IV NS 500ML BAXH (IV SOLUTION) ×4 IMPLANT
KIT TURNOVER KIT A (KITS) ×5 IMPLANT
NDL ANESTHESIA 27G X 3.5 (NEEDLE) ×3 IMPLANT
NDL HYPO 27GX1-1/4 (NEEDLE) ×3 IMPLANT
NEEDLE ANESTHESIA  27G X 3.5 (NEEDLE) ×1
NEEDLE ANESTHESIA 27G X 3.5 (NEEDLE) ×4 IMPLANT
NEEDLE HYPO 27GX1-1/4 (NEEDLE) ×5 IMPLANT
NS IRRIG 500ML POUR BTL (IV SOLUTION) ×5 IMPLANT
PACK ENT CUSTOM (PACKS) ×5 IMPLANT
PACKING NASAL EPIS 4X2.4 XEROG (MISCELLANEOUS) ×8 IMPLANT
PATTIES SURGICAL .5 X3 (DISPOSABLE) ×5 IMPLANT
SOL ANTI-FOG 6CC FOG-OUT (MISCELLANEOUS) ×4 IMPLANT
SOL FOG-OUT ANTI-FOG 6CC (MISCELLANEOUS) ×1
SPLINT NASAL SEPTAL BLV .50 ST (MISCELLANEOUS) ×5 IMPLANT
STRAP BODY AND KNEE 60X3 (MISCELLANEOUS) ×8 IMPLANT
SUT CHROMIC 3-0 (SUTURE) ×5
SUT CHROMIC 3-0 KS 27XMFL CR (SUTURE) ×4
SUT ETHILON 3-0 KS 30 BLK (SUTURE) ×5 IMPLANT
SUT PLAIN GUT 4-0 (SUTURE) ×5 IMPLANT
SUTURE CHRMC 3-0 KS 27XMFL CR (SUTURE) ×1 IMPLANT
SYR 3ML LL SCALE MARK (SYRINGE) ×5 IMPLANT
SYSTEM BALLOON SINUPLASTY 6X16 (SINUPLASTY) IMPLANT
TOWEL OR 17X26 4PK STRL BLUE (TOWEL DISPOSABLE) ×5 IMPLANT
TRACKER DISPOSABLE PAITIENT (MISCELLANEOUS) ×5 IMPLANT
TUBING IRRIGATION BIEN-AIR (TUBING) ×5 IMPLANT
WATER STERILE IRR 250ML POUR (IV SOLUTION) ×5 IMPLANT

## 2021-08-07 NOTE — Transfer of Care (Signed)
Immediate Anesthesia Transfer of Care Note  Patient: Holly Hart  Procedure(s) Performed: IMAGE GUIDED SINUS SURGERY (Right: Nose) SEPTOPLASTY (Nose) INFERIOR TURBINATE REDUCTION (Bilateral) ENDOSCOPIC MAXILLARY ANTROSTOMY WITH REMOVAL CONTENTS (Right: Nose) ENDOSCOPIC POLYPECTOMY NASAL (Right: Nose)  Patient Location: PACU  Anesthesia Type: General ETT  Level of Consciousness: awake, alert  and patient cooperative  Airway and Oxygen Therapy: Patient Spontanous Breathing and Patient connected to supplemental oxygen  Post-op Assessment: Post-op Vital signs reviewed, Patient's Cardiovascular Status Stable, Respiratory Function Stable, Patent Airway and No signs of Nausea or vomiting  Post-op Vital Signs: Reviewed and stable  Complications: No notable events documented.

## 2021-08-07 NOTE — Anesthesia Procedure Notes (Signed)
Procedure Name: Intubation Date/Time: 08/07/2021 8:12 AM  Performed by: Jimmy Picket, CRNAPre-anesthesia Checklist: Patient identified, Emergency Drugs available, Suction available, Patient being monitored and Timeout performed Patient Re-evaluated:Patient Re-evaluated prior to induction Oxygen Delivery Method: Circle system utilized Preoxygenation: Pre-oxygenation with 100% oxygen Induction Type: IV induction Ventilation: Mask ventilation without difficulty Laryngoscope Size: Miller and 2 Grade View: Grade I Tube type: Oral Rae Tube size: 7.0 mm Number of attempts: 1 Placement Confirmation: ETT inserted through vocal cords under direct vision, positive ETCO2 and breath sounds checked- equal and bilateral Tube secured with: Tape Dental Injury: Teeth and Oropharynx as per pre-operative assessment

## 2021-08-07 NOTE — Op Note (Signed)
08/07/2021  9:49 AM  [id number]   Pre-Op Dx:  Deviated Nasal Septum, Hypertrophic Inferior Turbinates, chronic right maxillary sinusitis, right nasal polyp  Post-op Dx: Same  Proc: Nasal Septoplasty, Bilateral Partial Reduction Inferior Turbinates, right endoscopic maxillary antrostomy with removal of contents, right endoscopic nasal polypectomy, use of image guided system.  Surg:  Beverly Sessions Jayziah Bankhead  Anes:  GOT  EBL: 50 mL  Comp: None  Findings: Septum deviated to the right side narrowing the steroid.  She had very enlarged inferior turbinates that were boggy.  She had a large polyp coming from the lateral surface of the right middle turbinate.  There was no concha bullosa here that I could find.  She had creamy white pus coming from her middle meatus and right maxillary sinus.  This was all cleaned out and there were no polyps in the maxillary sinus but just creamy mucus that was suctioned out.    Procedure: With the patient in a comfortable supine position,  general orotracheal anesthesia was induced without difficulty.     The patient received preoperative Afrin spray for topical decongestion and vasoconstriction.  Intravenous prophylactic antibiotics were administered.  The image guided system was brought in and the CT scan was downloaded into the system.  The patient was placed on a headrest for the [place] image guided system.  The patient's face was then registered to the system and showed good alignment.  The suction instruments were then checked with the system and there was good alignment with the CT scan.  At an appropriate level, the patient was placed in a semi-sitting position.  Nasal vibrissae were trimmed.   1% Xylocaine with 1:100,000 epinephrine, 7 cc's, was infiltrated into the anterior floor of the nose, into the nasal spine region, into the membranous columella, and finally into the submucoperichondrial plane of the septum on both sides.  Several minutes were allowed  for this to take effect.  Cottoniod pledgetts soaked in Afrin and 4% Xylocaine were placed into both nasal cavities and left while the patient was prepped and draped in the standard fashion.  The materials were removed from the nose and observed to be intact and correct in number.  The nose was inspected with a headlight and zero degree scope with the findings as described above.  A left Killian incision was sharply executed and carried down to the quadrangular cartilage. The mucoperichondrium was elelvated along the quadrangular plate back to the bony-cartilaginous junction. The mucoperiostium was then elevated along the ethmoid plate and the vomer. The boney-catilaginous junction was then split with a freer elevator and the mucoperiosteum was elevated on the opposite side. The mucoperiosteum was then elevated along the maxillary crest as needed to expose the crooked bone of the crest.  Boney spurs of the vomer and maxillary crest were removed with Lenoria Chime forceps.  The cartilaginous plate was trimmed along its posterior and inferior borders of about 2 mm of cartilage to free it up inferiorly. Some of the deviated ethmoid plate was then fractured and removed with Takahashi forceps to free up the posterior border of the quadrangular plate and allow it to swing back to the midline. The mucosal flaps were placed back into their anatomic position to allow visualization of the airways. The septum now sat in the midline with an improved airway.  A 3-0 Chromic suture on a Keith needle in used to anchor the inferior septum at the nasal spine with a through and through suture. The mucosal flaps are then sutured  together using a through and through whip stitch of 4-0 [place] with a mini-Keith needle. This was used to close the [place] incision as well.   The inferior turbinates were then inspected. An incision was created along the inferior aspect of the left inferior turbinate with removal of some of the  inferior soft tissue and bone. Electrocautery was used to control bleeding in the area. The remaining turbinate was then outfractured to open up the airway further. There was no significant bleeding noted. The right turbinate was then trimmed and outfractured in a similar fashion.  The 0 degree scope was used to visualize the left nasal cavity and the middle turbinate was sitting in a good position the middle meatus appeared to be clear.  There were no polyps on the left side.  The scope was then used in the right side and there was a polyp coming off of middle turbinate.  This appeared to be attached to the lateral wall of the middle turbinate.  Using an 45 degree through biting forcep the polyp was removed from the lateral surface of the middle turbinate.  Rest of the turbinate appeared to be fairly normal shape with some loose mucosa inferiorly near the medial side.  There was creamy white mucus that was rolling out from underneath the uncinate process and down into the nasopharynx.  A side biter was used to incise the uncinate process and then a 45 through biting forcep was used to remove the majority of this along with a microdebrider.  Once this was removed the opening of the maxillary antrum could be seen.  There is still creamy white mucus that was rolling out of the sinus.  The microdebrider was used to open up the maxillary antrum and to widen it more posteriorly and inferiorly.  The mucous membranes inside the maxillary sinus were thickened near the antral opening and these were all trimmed.  Using the 30 and 70 degree scope we could see the mucus collecting at the base the sinus.  A curved suction was used to suction this all clear.  There were no lesions noted at the base of the maxillary sinus and no polyps growing here or anywhere in the maxillary sinus.  The ethmoid sinus was not opened up as there is no significant disease here.  Xerogel was then used in cut and some small strips that were  wetted and placed into the middle meatus and the opening of the maxillary antrum.  These were liquefied to create the gel.  There is no bleeding here.  The airways were then visualized and showed open passageways on both sides that were significantly improved compared to before surgery. There was no signifcant bleeding. Nasal splints were applied to both sides of the septum using Xomed 0.84mm regular sized splints that were trimmed, and then held in position with a 3-0 Nylon through and through suture.  The patient was turned back over to anesthesia, and awakened, extubated, and taken to the PACU in satisfactory condition.  There were no operative complications.  Dispo:   PACU to home  Plan: Ice, elevation, narcotic analgesia, steroid taper, and prophylactic antibiotics for the duration of indwelling nasal foreign bodies.  We will reevaluate the patient in the office in 6 days and remove the septal splints.  Return to work in 10 days, strenuous activities in two weeks.   Beverly Sessions Okla Qazi 08/07/2021 9:49 AM

## 2021-08-07 NOTE — Anesthesia Postprocedure Evaluation (Signed)
Anesthesia Post Note  Patient: Holly Hart  Procedure(s) Performed: IMAGE GUIDED SINUS SURGERY (Right: Nose) SEPTOPLASTY (Nose) INFERIOR TURBINATE REDUCTION (Bilateral) ENDOSCOPIC MAXILLARY ANTROSTOMY WITH REMOVAL CONTENTS (Right: Nose) ENDOSCOPIC POLYPECTOMY NASAL (Right: Nose)     Patient location during evaluation: PACU Anesthesia Type: General Level of consciousness: awake and alert and oriented Pain management: satisfactory to patient Vital Signs Assessment: post-procedure vital signs reviewed and stable Respiratory status: spontaneous breathing, nonlabored ventilation and respiratory function stable Cardiovascular status: blood pressure returned to baseline and stable Postop Assessment: Adequate PO intake and No signs of nausea or vomiting Anesthetic complications: no   No notable events documented.  Cherly Beach

## 2021-08-07 NOTE — Anesthesia Preprocedure Evaluation (Signed)
Anesthesia Evaluation  Patient identified by MRN, date of birth, ID band Patient awake    Reviewed: Allergy & Precautions, H&P , NPO status , Patient's Chart, lab work & pertinent test results  Airway Mallampati: II  TM Distance: >3 FB Neck ROM: full    Dental no notable dental hx.    Pulmonary    Pulmonary exam normal breath sounds clear to auscultation       Cardiovascular Normal cardiovascular exam Rhythm:regular Rate:Normal     Neuro/Psych    GI/Hepatic GERD  ,  Endo/Other  Hydratenitis  Renal/GU      Musculoskeletal   Abdominal   Peds  Hematology   Anesthesia Other Findings   Reproductive/Obstetrics                             Anesthesia Physical Anesthesia Plan  ASA: 2  Anesthesia Plan: General ETT   Post-op Pain Management: Minimal or no pain anticipated, Fentanyl IV and Oxycodone PO   Induction:   PONV Risk Score and Plan: 3 and Treatment may vary due to age or medical condition, Ondansetron, Dexamethasone and Scopolamine patch - Pre-op  Airway Management Planned:   Additional Equipment:   Intra-op Plan:   Post-operative Plan:   Informed Consent: I have reviewed the patients History and Physical, chart, labs and discussed the procedure including the risks, benefits and alternatives for the proposed anesthesia with the patient or authorized representative who has indicated his/her understanding and acceptance.     Dental Advisory Given  Plan Discussed with: CRNA  Anesthesia Plan Comments:         Anesthesia Quick Evaluation

## 2021-08-07 NOTE — H&P (Signed)
H&P has been reviewed and patient reevaluated, no changes necessary. To be downloaded later.  

## 2021-08-08 ENCOUNTER — Encounter: Payer: Self-pay | Admitting: Otolaryngology

## 2021-08-08 LAB — SURGICAL PATHOLOGY

## 2021-08-14 ENCOUNTER — Encounter: Payer: Self-pay | Admitting: Family Medicine

## 2023-04-05 ENCOUNTER — Ambulatory Visit
Admission: EM | Admit: 2023-04-05 | Discharge: 2023-04-05 | Disposition: A | Discharge status: Home / Self Care | Payer: 59 | Attending: Emergency Medicine | Admitting: Emergency Medicine

## 2023-04-05 ENCOUNTER — Encounter: Payer: Self-pay | Admitting: Emergency Medicine

## 2023-04-05 DIAGNOSIS — J069 Acute upper respiratory infection, unspecified: Secondary | ICD-10-CM | POA: Diagnosis present

## 2023-04-05 LAB — RESP PANEL BY RT-PCR (FLU A&B, COVID) ARPGX2
Influenza A by PCR: NEGATIVE
Influenza B by PCR: NEGATIVE
SARS Coronavirus 2 by RT PCR: NEGATIVE

## 2023-04-05 MED ORDER — HYDROCOD POLI-CHLORPHE POLI ER 10-8 MG/5ML PO SUER: 5.0000 mL | Freq: Two times a day (BID) | ORAL | 0 refills | Status: AC | PRN

## 2023-04-05 MED ORDER — IBUPROFEN 600 MG PO TABS: 600.0000 mg | ORAL_TABLET | Freq: Three times a day (TID) | ORAL | 0 refills | Status: AC | PRN

## 2023-04-05 NOTE — ED Triage Notes (Signed)
 Pt presents with cough, congestion and headache that started yesterday. Her son tested positive for Flu last week.

## 2023-04-05 NOTE — ED Provider Notes (Signed)
HPI  SUBJECTIVE:  Holly Hart is a 33 y.o. female who presents with headache, body ache, nasal congestion, yellow rhinorrhea, chills, sore throat from coughing, postnasal drip and "nonstop coughing" starting yesterday.  The cough is also productive of yellowish mucus.  She reports chest soreness from the coughing.  No fevers, sinus pain or pressure, facial swelling, upper dental pain, wheezing, shortness of breath, nausea, vomiting, diarrhea, abdominal pain.  She is unable to sleep at night because of the cough.  She did not get this years flu vaccine.  Her son was diagnosed with flu last week.  No antipyretic in the past 6 hours.  She tried Mucinex without improvement in her symptoms.  No aggravating factors.  She has no past medical history.  LMP: 2/1.  Denies the possibility of being pregnant.  PCP: [facility].    Past Medical History:  Diagnosis Date   Costochondritis    Hidradenitis    Pinched nerve in neck 2012   Seasonal allergies     Past Surgical History:  Procedure Laterality Date   IMAGE GUIDED SINUS SURGERY Right 08/07/2021   Procedure: IMAGE GUIDED SINUS SURGERY;  Surgeon: Vernie Murders, MD;  Location: [facility];  Service: ENT;  Laterality: Right;  need stryker disk PLACED ON OR CHARGE NURSE DESK 6-5  KP   MAXILLARY ANTROSTOMY Right 08/07/2021   Procedure: ENDOSCOPIC MAXILLARY ANTROSTOMY WITH REMOVAL CONTENTS;  Surgeon: Vernie Murders, MD;  Location: [facility];  Service: ENT;  Laterality: Right;   NASAL TURBINATE REDUCTION Bilateral 08/07/2021   Procedure: INFERIOR TURBINATE REDUCTION;  Surgeon: Vernie Murders, MD;  Location: [facility];  Service: ENT;  Laterality: Bilateral;   NO PAST SURGERIES     POLYPECTOMY Right 08/07/2021   Procedure: ENDOSCOPIC POLYPECTOMY NASAL;  Surgeon: Vernie Murders, MD;  Location: [facility];  Service: ENT;  Laterality: Right;   SEPTOPLASTY N/A 08/07/2021   Procedure: SEPTOPLASTY;   Surgeon: Vernie Murders, MD;  Location: [facility];  Service: ENT;  Laterality: N/A;    Family History  Problem Relation Age of Onset   Diabetes Mother    Hypertension Mother    Diabetes Father    Stroke Maternal Grandfather    Cancer Paternal Grandmother    Heart disease Paternal Grandfather    Hypertension Paternal Grandfather     Social History   Tobacco Use   Smoking status: Never   Smokeless tobacco: Never  Vaping Use   Vaping status: Never Used  Substance Use Topics   Alcohol use: Yes    Comment: Couple drinks on the weekends.   Drug use: No    No current facility-administered medications for this encounter.  Current Outpatient Medications:    chlorpheniramine-HYDROcodone (TUSSIONEX) 10-8 MG/5ML, Take 5 mLs by mouth every 12 (twelve) hours as needed for cough., Disp: 60 mL, Rfl: 0   ibuprofen (ADVIL) 600 MG tablet, Take 1 tablet (600 mg total) by mouth every 8 (eight) hours as needed., Disp: 30 tablet, Rfl: 0   Secukinumab (COSENTYX [place]), Inject into the skin., Disp: , Rfl:    etonogestrel (NEXPLANON) 68 MG IMPL implant, 1 each by Subdermal route once., Disp: , Rfl:    HUMIRA PEN 40 MG/0.4ML PNKT, SMARTSIG:40 Milligram(s) SUB-Q Once a Week, Disp: , Rfl:    Multiple Vitamin (MULTIVITAMIN) tablet, Take 1 tablet by mouth daily., Disp: , Rfl:    Multiple Vitamins-Minerals (ZINC PO), Take 1 tablet by mouth daily., Disp: , Rfl:    Vitamin D, Ergocalciferol, (  DRISDOL) 1.25 MG (50000 UNIT) CAPS capsule, Take 50,000 Units by mouth 2 (two) times a week., Disp: , Rfl:   Allergies  Allergen Reactions   Banana Swelling     ROS  As noted in HPI.   Physical Exam  Pulse 91   Temp 99.3 F (37.4 C) (Oral)   Resp 18   LMP 03/27/2023   SpO2 100%   Constitutional: Well developed, well nourished, no acute distress.  Coughing Eyes:  EOMI, conjunctiva normal bilaterally HENT: Normocephalic, atraumatic,mucus membranes moist.  Mucoid nasal congestion.  Erythematous,  swollen turbinates.  Positive maxillary, frontal sinus tenderness.  Normal oropharynx.  No postnasal drip. Neck: No cervical lymphadenopathy Respiratory: Normal inspiratory effort, lungs clear bilaterally, good air movement.  Positive lateral chest wall tenderness Cardiovascular: Normal rate, regular rhythm, no murmurs rubs or gallops GI: nondistended skin: No rash, skin intact Musculoskeletal: no deformities Neurologic: Alert & oriented x 3, no focal neuro deficits Psychiatric: Speech and behavior appropriate   ED Course   Medications - No data to display  Orders Placed This Encounter  Procedures   Resp Panel by RT-PCR (Flu A&B, Covid) Anterior Nasal Swab    Standing Status:   Standing    Number of Occurrences:   1    Results for orders placed or performed during the hospital encounter of 04/05/23 (from the past 24 hours)  Resp Panel by RT-PCR (Flu A&B, Covid) Anterior Nasal Swab     Status: None   Collection Time: 04/05/23  5:17 PM   Specimen: Anterior Nasal Swab  Result Value Ref Range   SARS Coronavirus 2 by RT PCR NEGATIVE NEGATIVE   Influenza A by PCR NEGATIVE NEGATIVE   Influenza B by PCR NEGATIVE NEGATIVE   No results found.  ED Clinical Impression  1. Viral URI with cough      ED Assessment/Plan     COVID, influenza negative.  Patient presents with an upper respiratory infection/viral sinusitis.  Home with Tussionex, saline nasal irrigation, Mucinex D, start Nasacort, Tylenol/ibuprofen.  Work note.  Sending prescriptions to [facility] [address] in [place].  [place] [facility]arcotic database reviewed for this patient, and feel that the risk/benefit ratio today is favorable for proceeding with a prescription for controlled substance.  No opiate prescriptions in the past 2 years  Discussed labs,  MDM, treatment plan, and plan for follow-up with patient. patient agrees with plan.   Meds ordered this encounter  Medications   ibuprofen (ADVIL) 600 MG tablet    Sig: Take 1 tablet  (600 mg total) by mouth every 8 (eight) hours as needed.    Dispense:  30 tablet    Refill:  0   chlorpheniramine-HYDROcodone (TUSSIONEX) 10-8 MG/5ML    Sig: Take 5 mLs by mouth every 12 (twelve) hours as needed for cough.    Dispense:  60 mL    Refill:  0      *This clinic note was created using Scientist, clinical (histocompatibility and immunogenetics). Therefore, there may be occasional mistakes despite careful proofreading.  ?    Domenick Gong, MD 04/06/23 1445

## 2023-04-05 NOTE — Discharge Instructions (Signed)
 COVID, flu PCR testing negative.  Start Mucinex -D to keep the mucous thin and to decongest you.   You may take 600 mg of motrin  with 1000 mg of tylenol  up to 3-4 times a day as needed for pain. This is an effective combination for pain.  Most sinus infections are viral and do not need antibiotics unless you have a high fever, have had this for 10 days, or you get better and then get sick again. Use a NeilMed sinus rinse with distilled water as often as you want to to reduce nasal congestion. Follow the directions on the box.   Start Nasacort .  Tussionex as needed for cough.  Go to www.goodrx.com to look up your medications. This will give you a list of where you can find your prescriptions at the most affordable prices. Or you can ask the pharmacist what the cash price is. This is frequently cheaper than going through insurance.

## 2023-06-19 ENCOUNTER — Encounter: Payer: Self-pay | Admitting: Emergency Medicine

## 2023-06-19 ENCOUNTER — Ambulatory Visit
Admission: EM | Admit: 2023-06-19 | Discharge: 2023-06-19 | Disposition: A | Discharge status: Home / Self Care | Attending: Physician Assistant | Admitting: Physician Assistant

## 2023-06-19 ENCOUNTER — Other Ambulatory Visit: Payer: Self-pay

## 2023-06-19 ENCOUNTER — Ambulatory Visit (INDEPENDENT_AMBULATORY_CARE_PROVIDER_SITE_OTHER)

## 2023-06-19 DIAGNOSIS — F430 Acute stress reaction: Secondary | ICD-10-CM

## 2023-06-19 DIAGNOSIS — M791 Myalgia, unspecified site: Secondary | ICD-10-CM

## 2023-06-19 DIAGNOSIS — R079 Chest pain, unspecified: Secondary | ICD-10-CM

## 2023-06-19 MED ORDER — KETOROLAC TROMETHAMINE 60 MG/2ML IM SOLN
30.0000 mg | Freq: Once | INTRAMUSCULAR | Status: AC
  Administered 2023-06-19: 30 mg via INTRAMUSCULAR

## 2023-06-19 MED ORDER — HYDROXYZINE HCL 25 MG PO TABS: 25.0000 mg | ORAL_TABLET | Freq: Four times a day (QID) | ORAL | 0 refills | Status: AC | PRN

## 2023-06-19 MED ORDER — NAPROXEN 500 MG PO TABS: 500.0000 mg | ORAL_TABLET | Freq: Two times a day (BID) | ORAL | 0 refills | Status: AC

## 2023-06-19 NOTE — ED Triage Notes (Signed)
 Patient c/o chest pain over the past 3 days that has gotten worse.  Patient reports her chest pain is constant.  Patient reports increase pain when she tries to take a deep breath.  Patient denies injury or  fall.  Patient denies any recent cold symptoms.

## 2023-06-19 NOTE — ED Provider Notes (Signed)
 [facility]    CSN: [id number] Arrival date & time: 06/19/23  0800      History   Chief Complaint Chief Complaint  Patient presents with   Chest Pain    HPI Holly Hart is a 33 y.o. female with history of allergies, costochondritis, GERD, dermatitis and hidradenitis.  Patient presents today for 3-day history of chest pain which is diffusely across her chest, constant and worse when taking a deep breath and movement such as arching her back/puffing out her chest.  Patient denies fall or injury.  She has not recently been ill.  Denies fever, cough, congestion.  Denies wheezing or shortness of breath.  No palpitations, dizziness, headaches, weakness, presyncope/syncope.  No history of asthma and patient is a non-smoker. Reports being under a lot of stress recently since she is going through a separation.  Reports high anxiety and sometimes has difficulty sleeping.  Patient has taken ibuprofen  without relief.  HPI  Past Medical History:  Diagnosis Date   Costochondritis    Hidradenitis    Pinched nerve in neck 2012   Seasonal allergies     Patient Active Problem List   Diagnosis Date Noted   Hirsutism 02/01/2013   Dermatitis 02/01/2013   GERD (gastroesophageal reflux disease) 02/01/2013    Past Surgical History:  Procedure Laterality Date   IMAGE GUIDED SINUS SURGERY Right 08/07/2021   Procedure: IMAGE GUIDED SINUS SURGERY;  Surgeon: Mellody Sprout, MD;  Location: [facility];  Service: ENT;  Laterality: Right;  need stryker disk PLACED ON OR CHARGE NURSE DESK 6-5  KP   MAXILLARY ANTROSTOMY Right 08/07/2021   Procedure: ENDOSCOPIC MAXILLARY ANTROSTOMY WITH REMOVAL CONTENTS;  Surgeon: Mellody Sprout, MD;  Location: [facility];  Service: ENT;  Laterality: Right;   NASAL TURBINATE REDUCTION Bilateral 08/07/2021   Procedure: INFERIOR TURBINATE REDUCTION;  Surgeon: Mellody Sprout, MD;  Location: [facility];  Service: ENT;  Laterality:  Bilateral;   NO PAST SURGERIES     POLYPECTOMY Right 08/07/2021   Procedure: ENDOSCOPIC POLYPECTOMY NASAL;  Surgeon: Mellody Sprout, MD;  Location: [facility];  Service: ENT;  Laterality: Right;   SEPTOPLASTY N/A 08/07/2021   Procedure: SEPTOPLASTY;  Surgeon: Mellody Sprout, MD;  Location: [facility];  Service: ENT;  Laterality: N/A;    OB History     Gravida  0   Para  0   Term  0   Preterm  0   AB  0   Living  0      SAB  0   IAB  0   Ectopic  0   Multiple  0   Live Births               Home Medications    Prior to Admission medications   Medication Sig Start Date End Date Taking? Authorizing Provider  hydrOXYzine (ATARAX) 25 MG tablet Take 1 tablet (25 mg total) by mouth every 6 (six) hours as needed. 06/19/23  Yes Nancy Axon B, PA-C  naproxen (NAPROSYN) 500 MG tablet Take 1 tablet (500 mg total) by mouth 2 (two) times daily. 06/19/23  Yes Floydene Hy, PA-C  chlorpheniramine-HYDROcodone  (TUSSIONEX) 10-8 MG/5ML Take 5 mLs by mouth every 12 (twelve) hours as needed for cough. 04/05/23   Ethlyn Herd, MD  etonogestrel  (NEXPLANON ) 68 MG IMPL implant 1 each by Subdermal route once.    [provider]  HUMIRA PEN 40 MG/0.4ML PNKT SMARTSIG:40 Milligram(s) SUB-Q Once a Week 07/27/19  [provider]  ibuprofen  (ADVIL ) 600 MG tablet Take 1 tablet (600 mg total) by mouth every 8 (eight) hours as needed. 04/05/23   Ethlyn Herd, MD  Multiple Vitamin (MULTIVITAMIN) tablet Take 1 tablet by mouth daily.    [provider]  Multiple Vitamins-Minerals (ZINC PO) Take 1 tablet by mouth daily.    [provider]  Secukinumab (COSENTYX [place]) Inject into the skin.    [provider]  Vitamin D, Ergocalciferol, (DRISDOL) 1.25 MG (50000 UNIT) CAPS capsule Take 50,000 Units by mouth 2 (two) times a week. 02/23/20   [provider]  esomeprazole (NEXIUM) 20 MG capsule Take 20 mg by mouth daily at 12 noon.   09/05/18  [provider]  fluticasone  (FLONASE ) 50 MCG/ACT nasal spray Place 2 sprays into both nostrils daily. 04/17/15 09/05/18  Adeline Hone, PA-C    Family History Family History  Problem Relation Age of Onset   Diabetes Mother    Hypertension Mother    Diabetes Father    Stroke Maternal Grandfather    Cancer Paternal Grandmother    Heart disease Paternal Grandfather    Hypertension Paternal Grandfather     Social History Social History   Tobacco Use   Smoking status: Never   Smokeless tobacco: Never  Vaping Use   Vaping status: Never Used  Substance Use Topics   Alcohol use: Yes    Comment: Couple drinks on the weekends.   Drug use: No     Allergies   Banana   Review of Systems Review of Systems  Constitutional:  Negative for chills, diaphoresis, fatigue and fever.  HENT:  Negative for congestion.   Respiratory:  Negative for cough, shortness of breath and wheezing.   Cardiovascular:  Positive for chest pain. Negative for palpitations and leg swelling.  Gastrointestinal:  Negative for abdominal pain, nausea and vomiting.  Musculoskeletal:  Negative for back pain.  Skin:  Negative for rash.  Neurological:  Negative for dizziness, syncope, weakness and headaches.  Psychiatric/Behavioral:  The patient is nervous/anxious.      Physical Exam Triage Vital Signs ED Triage Vitals  Encounter Vitals Group     BP      Systolic BP Percentile      Diastolic BP Percentile      Pulse      Resp      Temp      Temp src      SpO2      Weight      Height      Head Circumference      Peak Flow      Pain Score      Pain Loc      Pain Education      Exclude from Growth Chart    No data found.  Updated Vital Signs BP 135/88 (BP Location: Left Arm)   Pulse 74   Temp 98.2 F (36.8 C) (Oral)   Resp 15   Ht 5\' 9"  (1.753 m)   Wt 195 lb (88.5 kg)   LMP 05/29/2023 (Approximate)   SpO2 100%   BMI 28.80 kg/m   Physical Exam Vitals and nursing note  reviewed.  Constitutional:      General: She is not in acute distress.    Appearance: Normal appearance. She is not ill-appearing or toxic-appearing.  HENT:     Head: Normocephalic and atraumatic.     Nose: Nose normal.     Mouth/Throat:     Mouth: Mucous membranes  are moist.     Pharynx: Oropharynx is clear.  Eyes:     General: No scleral icterus.       Right eye: No discharge.        Left eye: No discharge.     Conjunctiva/sclera: Conjunctivae normal.  Cardiovascular:     Rate and Rhythm: Normal rate and regular rhythm.     Heart sounds: Normal heart sounds.  Pulmonary:     Effort: Pulmonary effort is normal. No respiratory distress.     Breath sounds: Normal breath sounds. No wheezing, rhonchi or rales.     Comments: Patient has discomfort in her chest when she puffs out her chest/arches her back. Chest:     Chest wall: No tenderness.  Abdominal:     Palpations: Abdomen is soft.     Tenderness: There is no abdominal tenderness. There is no right CVA tenderness or left CVA tenderness.  Musculoskeletal:     Cervical back: Neck supple.  Skin:    General: Skin is dry.  Neurological:     General: No focal deficit present.     Mental Status: She is alert. Mental status is at baseline.     Motor: No weakness.     Gait: Gait normal.  Psychiatric:        Mood and Affect: Mood normal.        Behavior: Behavior normal.      UC Treatments / Results  Labs (all labs ordered are listed, but only abnormal results are displayed) Labs Reviewed - No data to display  EKG   Radiology DG Chest 2 View Result Date: 06/19/2023 CLINICAL DATA:  33 year old female with chest pain shortness of breath for 3 days. EXAM: CHEST - 2 VIEW COMPARISON:  Chest radiograph 09/05/2018 and earlier. FINDINGS: PA and lateral view 0824 hours. Lung volumes and mediastinal contours are stable and within normal limits. Visualized tracheal air column is within normal limits. Lung markings are stable, both  lungs appear clear. No pneumothorax or pleural effusion. Negative visible bowel gas and osseous structures. IMPRESSION: Negative.  No cardiopulmonary abnormality. Electronically Signed   By: Marlise Simpers M.D.   On: 06/19/2023 08:46    Procedures ED EKG  Date/Time: 06/19/2023 8:22 AM  Performed by: Floydene Hy, PA-C Authorized by: Floydene Hy, PA-C   Interpretation:    Interpretation: normal   Rate:    ECG rate:  62 Rhythm:    Rhythm: sinus rhythm   Ectopy:    Ectopy: none   QRS:    QRS axis:  Normal   QRS intervals:  Normal   QRS conduction: normal   ST segments:    ST segments:  Normal T waves:    T waves: normal   Comments:     Normal sinus rhythm. Regular rate.  (including critical care time)  Medications Ordered in UC Medications  ketorolac (TORADOL) injection 30 mg (30 mg Intramuscular Given 06/19/23 0855)    Initial Impression / Assessment and Plan / UC Course  I have reviewed the triage vital signs and the nursing notes.  Pertinent labs & imaging results that were available during my care of the patient were reviewed by me and considered in my medical decision making (see chart for details).   33 year old female presents for 3-day history of diffuse chest pain which is worse with movement and taking a deep breath.  Denies injury or recent illness.  Reports being under a lot of stress recently and thinks it could  be related to her discomfort.  Patient is going through a separation with her partner.  No relief with ibuprofen .  She has no history of PE or DVT and not on any contraceptives.  No recent travel.  No leg swelling or calf pain.  Vital stable and normal.  Overall well-appearing.  No acute distress.  On exam, normal HEENT findings.  Chest clear.  Heart regular rate rhythm.  Abdomen soft and nontender.  EKG performed is normal.  Chest x-ray performed is normal.  Reviewed all results with patient.  She was given 30 mg IM ketorolac in clinic for acute pain  relief of this attention/chest wall pain.  Prescribed naproxen for her.  She is also interested in trying something for the stress anxiety so I sent hydroxyzine to pharmacy.  Advised following up with her PCP.  Thoroughly reviewed ED precautions.   Final Clinical Impressions(s) / UC Diagnoses   Final diagnoses:  Chest pain, unspecified type  Muscle tension pain  Acute reaction to stress     Discharge Instructions      - EKG and chest x-ray were normal. - Your vital signs were also normal. - Your symptoms are related to musculoskeletal chest pain/muscle tension, probably related to stress. -I sent anti-inflammatory medicine to the pharmacy.  Can also take Tylenol  and use warm compresses stretch and use muscle rubs.  Consider getting a massage and try your best to manage her stress. - I sent hydroxyzine to pharmacy for you to take as needed when you are feeling particularly anxious but please try make a follow-up appointment with your doctor. - If any point you feel that your chest pain worsens or you feel increasingly short of breath, or weak or feel you are in a pass out please, #1 or have someone take you to the ER.     ED Prescriptions     Medication Sig Dispense Auth. Provider   naproxen (NAPROSYN) 500 MG tablet Take 1 tablet (500 mg total) by mouth 2 (two) times daily. 30 tablet Nancy Axon B, PA-C   hydrOXYzine (ATARAX) 25 MG tablet Take 1 tablet (25 mg total) by mouth every 6 (six) hours as needed. 30 tablet Alexia Dinger B, PA-C      PDMP not reviewed this encounter.   Floydene Hy, PA-C 06/19/23 [phone]

## 2023-06-19 NOTE — Discharge Instructions (Addendum)
-   EKG and chest x-ray were normal. - Your vital signs were also normal. - Your symptoms are related to musculoskeletal chest pain/muscle tension, probably related to stress. -I sent anti-inflammatory medicine to the pharmacy.  Can also take Tylenol  and use warm compresses stretch and use muscle rubs.  Consider getting a massage and try your best to manage her stress. - I sent hydroxyzine to pharmacy for you to take as needed when you are feeling particularly anxious but please try make a follow-up appointment with your doctor. - If any point you feel that your chest pain worsens or you feel increasingly short of breath, or weak or feel you are in a pass out please, #1 or have someone take you to the ER.
